# Patient Record
Sex: Female | Born: 1980 | Hispanic: No | Marital: Married | Smoking: Never smoker
Health system: Southern US, Community
[De-identification: ages and names within clinical notes are randomized; demographics above are authoritative.]

## PROBLEM LIST (undated history)

## (undated) DIAGNOSIS — A630 Anogenital (venereal) warts: Secondary | ICD-10-CM

## (undated) DIAGNOSIS — D239 Other benign neoplasm of skin, unspecified: Secondary | ICD-10-CM

## (undated) DIAGNOSIS — IMO0001 Reserved for inherently not codable concepts without codable children: Secondary | ICD-10-CM

## (undated) HISTORY — DX: Other benign neoplasm of skin, unspecified: D23.9

## (undated) HISTORY — DX: Reserved for inherently not codable concepts without codable children: IMO0001

## (undated) HISTORY — DX: Anogenital (venereal) warts: A63.0

## (undated) HISTORY — PX: NO PAST SURGERIES: SHX2092

---

## 2008-10-14 ENCOUNTER — Ambulatory Visit: Payer: Self-pay | Admitting: Gynecology

## 2008-10-15 ENCOUNTER — Ambulatory Visit: Payer: Self-pay | Admitting: Gynecology

## 2008-11-12 ENCOUNTER — Ambulatory Visit: Payer: Self-pay | Admitting: Gynecology

## 2009-07-13 ENCOUNTER — Encounter: Admission: RE | Admit: 2009-07-13 | Discharge: 2009-07-13 | Payer: Self-pay | Admitting: Specialist

## 2009-07-20 ENCOUNTER — Other Ambulatory Visit: Admission: RE | Admit: 2009-07-20 | Discharge: 2009-07-20 | Payer: Self-pay | Admitting: Gynecology

## 2009-07-20 ENCOUNTER — Ambulatory Visit: Payer: Self-pay | Admitting: Gynecology

## 2009-07-20 LAB — CONVERTED CEMR LAB: Pap Smear: NORMAL

## 2010-02-01 ENCOUNTER — Ambulatory Visit: Payer: Self-pay | Admitting: Gynecology

## 2010-04-04 ENCOUNTER — Ambulatory Visit: Payer: Self-pay | Admitting: Internal Medicine

## 2010-04-04 DIAGNOSIS — E049 Nontoxic goiter, unspecified: Secondary | ICD-10-CM | POA: Insufficient documentation

## 2010-04-04 DIAGNOSIS — D239 Other benign neoplasm of skin, unspecified: Secondary | ICD-10-CM

## 2010-04-04 HISTORY — DX: Other benign neoplasm of skin, unspecified: D23.9

## 2010-04-06 ENCOUNTER — Telehealth: Payer: Self-pay | Admitting: Internal Medicine

## 2010-04-06 LAB — CONVERTED CEMR LAB
ALT: 25 units/L (ref 0–35)
Basophils Absolute: 0 10*3/uL (ref 0.0–0.1)
Calcium: 9.3 mg/dL (ref 8.4–10.5)
Chloride: 103 meq/L (ref 96–112)
Cholesterol: 162 mg/dL (ref 0–200)
Creatinine, Ser: 0.7 mg/dL (ref 0.4–1.2)
Eosinophils Absolute: 0.2 10*3/uL (ref 0.0–0.7)
Eosinophils Relative: 3.9 % (ref 0.0–5.0)
GFR calc non Af Amer: 98.59 mL/min (ref >60)
Glucose, Bld: 77 mg/dL (ref 70–99)
Lymphocytes Relative: 40.3 % (ref 12.0–46.0)
Lymphs Abs: 2 10*3/uL (ref 0.7–4.0)
MCHC: 34.3 g/dL (ref 30.0–36.0)
Monocytes Relative: 4.2 % (ref 3.0–12.0)
Neutro Abs: 2.6 10*3/uL (ref 1.4–7.7)
Neutrophils Relative %: 51.3 % (ref 43.0–77.0)
RBC: 4.21 M/uL (ref 3.87–5.11)
WBC: 5 10*3/uL (ref 4.5–10.5)

## 2010-04-19 ENCOUNTER — Ambulatory Visit: Payer: Self-pay | Admitting: Internal Medicine

## 2010-04-19 LAB — CONVERTED CEMR LAB: Rapid Strep: NEGATIVE

## 2010-06-21 NOTE — Assessment & Plan Note (Signed)
Summary: NEW TO EST,WANTS CPX,BCBS INS/RH......   Vital Signs:  Patient profile:   30 year old female Height:      62 inches Weight:      146.50 pounds BMI:     26.89 Pulse rate:   82 / minute Pulse rhythm:   regular BP sitting:   122 / 80  (left arm) Cuff size:   regular  Vitals Entered By: Army Fossa CMA (April 04, 2010 2:59 PM) CC: CPX- not fasting  Comments c/o pain in her breast when on period.     History of Present Illness: CPX    Preventive Screening-Counseling & Management  Alcohol-Tobacco     Smoking Status: never  Caffeine-Diet-Exercise     Does Patient Exercise: yes      Drug Use:  no.    Past History:  Family History: Last updated: 04/04/2010  Breast cancer -- denies  cervical cancer-- GM  colon ca--no MI-no DM--no HTN--no  Social History: Last updated: 04/04/2010 married G4, 3 children, lost one  baby original from [place]  Never Smoked Alcohol use-no Drug use-no works at a [employer] Regular exercise- rarely   Risk Factors: Exercise: yes (04/04/2010)  Risk Factors: Smoking Status: never (04/04/2010)  Past Medical History: birth control-- husband vasectomy  thyromegaly, s/p 2 u/s per patient , last 2011?--- pt  told u/s was ok   Past Surgical History: no surgeries   Family History:  Breast cancer -- denies  cervical cancer-- GM  colon ca--no MI-no DM--no HTN--no  Social History: married G4, 3 children, lost one  baby original from [place]  Never Smoked Alcohol use-no Drug use-no works at a [employer] Regular exercise- rarely  Smoking Status:  never Drug Use:  no Does Patient Exercise:  yes  Review of Systems General:  Denies fever and weight loss. CV:  Denies chest pain or discomfort and swelling of feet. Resp:  Denies cough and shortness of breath. GI:  Denies bloody stools, nausea, and vomiting. GU:  period regular mostly, last 6 days she has a gynecologist, complaining of occasional  tenderness and the external aspect of the right breast. Self breast exam  done regularly, occasionally feels a "knot" under the nipple on the  **R**. MS:  occasionally has low back pain . Psych:  quite emotional, her elderly mother is moving to a ALF.  Physical Exam  General:  alert and well-developed.  slightly  overweight Neck:  symetric, non tender , slightly  enlarged thyroid gland  Breasts:  No mass, nodules, thickening, tenderness, bulging, retraction, inflamation, nipple discharge or skin changes noted.  no axillary lymphadenopathy Lungs:  normal respiratory effort, no intercostal retractions, no accessory muscle use, and normal breath sounds.   Heart:  normal rate, regular rhythm, and no murmur.   Abdomen:  soft, non-tender, no distention, no masses, no guarding, and no rigidity.   Extremities:  no lower extremity edema Neurologic:  alert & oriented X3, strength normal in all extremities, and gait normal.   Skin:  has several moles. There is one particularly at the right side of the neck that seems to slightly hard, bicolor, irregular borders, < 1/2cm . A similar but smaller mole in the left cheek   Impression & Recommendations:  Problem # 1:  ROUTINE GENERAL MEDICAL EXAM@HEALTH  CARE FACL (ICD-V70.0) Td 05-2009 declined flu shot  diet , exercise discussed  labs, no fasting, will check only a total cholesterol recommend to continue doing self breast exam, call if she has  any persistent-hard dominant mass Orders: Venipuncture (62952) TLB-BMP (Basic Metabolic Panel-BMET) (80048-METABOL) TLB-CBC Platelet - w/Differential (85025-CBCD) TLB-Hepatic/Liver Function Pnl (80076-HEPATIC) T-Vitamin D (25-Hydroxy) (84132-44010) TLB-Cholesterol, Total (82465-CHO) Specimen Handling (27253)  Problem # 2:  MOLE (ICD-216.9) refer to derm in reference to a mole in the right side of the neck and left cheek warning  signs of skin cancer discussed  Orders: Dermatology Referral  (Derma)  Problem # 3:  GOITER (ICD-240.9) reports a history of thyromegaly, status post 2 ultrasounds and blood tests. Told they were  ok   Patient Instructions: 1)  Please schedule a follow-up appointment in 1 year.    Orders Added: 1)  Venipuncture [36415] 2)  TLB-BMP (Basic Metabolic Panel-BMET) [80048-METABOL] 3)  TLB-CBC Platelet - w/Differential [85025-CBCD] 4)  TLB-Hepatic/Liver Function Pnl [80076-HEPATIC] 5)  T-Vitamin D (25-Hydroxy) [66440-34742] 6)  TLB-Cholesterol, Total [82465-CHO] 7)  Specimen Handling [99000] 8)  New Patient 18-39 years [99385] 21)  Dermatology Referral [Derma]   Immunization History:  Tetanus/Td Immunization History:    Tetanus/Td:  historical (05/22/2009)   Immunization History:  Tetanus/Td Immunization History:    Tetanus/Td:  Historical (05/22/2009)    Preventive Care Screening  Pap Smear:    Date:  07/20/2009    Results:  normal-per pt   Last Tetanus Booster:    Date:  05/22/2009    Results:  Historical     Risk Factors:  Tobacco use:  never Drug use:  no Alcohol use:  no Exercise:  yes  PAP Smear History:     Date of Last PAP Smear:  07/20/2009    Results:  normal-per pt

## 2010-06-21 NOTE — Progress Notes (Signed)
Summary: problem with referral  Phone Note Call from Patient Call back at Home Phone [phone]   Caller: co-worker Summary of Call: Female co-worker of patient called to see if he could get info about her referral appointment because the patient did not understand what she needed to do about this referral---do we have an interpreting service that can call her about the appointment on 11/18--does she still have that appointment??    If we can call to get an interpreter for a visit here--can the dermatologist call for an interpreter??  I told co-worker that, due to privacy, I could not release any information to him Initial call taken by: Jerolyn Shin,  April 06, 2010 12:59 PM  Follow-up for Phone Call        I SPOKE W/PATIENT SHE WAS ABLE TO GIVE ME PHONE # OF CO-WORKER of [phone].  HAD TO LEAVE VM FOR HIM TO CALL ME BACK Magdalen Spatz [facility]  April 06, 2010 1:16 PM  PATIENT'S FEMALE CO-WORKER CALLED ME BACK (HE IS HER SUPERVISOR) HE TOOK DOWN ALL APPT DETAILS & WILL ALSO INFORM HER SHE NEEDS SOMEONE WITH HER TO THE APPT THAT CAN SPEAK ENGLISH.  PT IS NOW AWARE.  Follow-up by: Magdalen Spatz [facility],  April 06, 2010 1:39 PM

## 2010-06-21 NOTE — Assessment & Plan Note (Signed)
Summary: sore throat/cbs   Vital Signs:  Patient profile:   30 year old female Weight:      1445.13 pounds Temp:     98.2 degrees F oral Pulse rate:   72 / minute Pulse rhythm:   regular BP sitting:   118 / 76  (left arm) Cuff size:   regular  Vitals Entered By: Army Fossa CMA (April 19, 2010 1:25 PM) CC: Pt here c/o HA, pains in neck, chills and sore thorat.  Comments started last night [employer] w [employer]   History of Present Illness: F  x 2 nights associated to frontal HAs and pain behind the eyes   ROS some ST + cough w/ mild amount of yellow sputum no N-V-D no rash some myalgias w/  fever  Current Medications (verified): 1)  None  Allergies (verified): No Known Drug Allergies  Past History:  Past Medical History: Reviewed history from 04/04/2010 and no changes required. birth control-- husband vasectomy  thyromegaly, s/p 2 u/s per patient , last 2011?--- pt  told u/s was ok   Past Surgical History: Reviewed history from 04/04/2010 and no changes required. no surgeries   Social History: Reviewed history from 04/04/2010 and no changes required. married G4, 3 children, lost one  baby original from [place]  Never Smoked Alcohol use-no Drug use-no works at a [employer] Regular exercise- rarely   Physical Exam  General:  alert and well-developed.  nontoxic  Head:  face symetric, no TTP Ears:  R ear normal and L ear normal.   Nose:  no congested  Mouth:  no red or d/c  Lungs:  normal respiratory effort and no intercostal retractions.  few ronchi, no wheezoing or crakles  Heart:  normal rate, regular rhythm, and no murmur.   Abdomen:  soft and non-tender.     Impression & Recommendations:  Problem # 1:  FEVER (ICD-780.60)  fever for 2 days, she doesn't look toxic. strep test and mono spot negative She does have cough and some rhonchi on exam. bronchitis? I advised the patient to drink plenty of fluids, take Tylenol, start an   antibiotic and call me if she's not better. If that is the case she will need a chest x-ray and further eval. Instructions discussed in Spanish  Orders: Rapid Strep (16109) Monospot (60454)  Complete Medication List: 1)  Zithromax Z-pak 250 Mg Tabs (Azithromycin) .... As directed   Patient Instructions: 1)  tome muchis liquidos y tylenol para la fiebre  2)  mucinex DM 1 pastilla cada 12 horas si esta tosiendo mucho 3)  empieze a Pharmacologist, antibiotico,  x 5 dias 4)  llame o vaya a la emergencia si tiene una fiebre muy alta, se pone peor o no se mejora en unos dias  Prescriptions: ZITHROMAX Z-PAK 250 MG TABS (AZITHROMYCIN) as directed  #1 x 0   Entered and Authorized by:   Nolon Rod. Dyquan Minks MD   Signed by:   Nolon Rod. Nareg Breighner MD on 04/19/2010   Method used:   Print then Give to Patient   RxID:   [id number]    Orders Added: 1)  Rapid Strep [87880] 2)  Est. Patient Level III [30865] 3)  Monospot [78469]    Laboratory Results    Other Tests  Rapid Strep: negative Comments: Army Fossa CMA  April 19, 2010 1:27 PM

## 2010-06-27 ENCOUNTER — Ambulatory Visit (INDEPENDENT_AMBULATORY_CARE_PROVIDER_SITE_OTHER): Payer: BC Managed Care – PPO | Admitting: Gynecology

## 2010-06-27 DIAGNOSIS — N926 Irregular menstruation, unspecified: Secondary | ICD-10-CM

## 2010-06-27 DIAGNOSIS — N898 Other specified noninflammatory disorders of vagina: Secondary | ICD-10-CM

## 2010-06-27 DIAGNOSIS — B373 Candidiasis of vulva and vagina: Secondary | ICD-10-CM

## 2010-06-27 DIAGNOSIS — Z113 Encounter for screening for infections with a predominantly sexual mode of transmission: Secondary | ICD-10-CM

## 2010-07-21 ENCOUNTER — Encounter: Payer: BC Managed Care – PPO | Admitting: Gynecology

## 2010-08-09 ENCOUNTER — Encounter (INDEPENDENT_AMBULATORY_CARE_PROVIDER_SITE_OTHER): Payer: BC Managed Care – PPO | Admitting: Gynecology

## 2010-08-09 ENCOUNTER — Other Ambulatory Visit (HOSPITAL_COMMUNITY)
Admission: RE | Admit: 2010-08-09 | Discharge: 2010-08-09 | Disposition: A | Discharge status: Home / Self Care | Payer: BC Managed Care – PPO | Source: Ambulatory Visit | Attending: Gynecology | Admitting: Gynecology

## 2010-08-09 ENCOUNTER — Other Ambulatory Visit: Payer: Self-pay | Admitting: Gynecology

## 2010-08-09 DIAGNOSIS — N946 Dysmenorrhea, unspecified: Secondary | ICD-10-CM

## 2010-08-09 DIAGNOSIS — R635 Abnormal weight gain: Secondary | ICD-10-CM

## 2010-08-09 DIAGNOSIS — R1011 Right upper quadrant pain: Secondary | ICD-10-CM

## 2010-08-09 DIAGNOSIS — Z01419 Encounter for gynecological examination (general) (routine) without abnormal findings: Secondary | ICD-10-CM

## 2010-08-09 DIAGNOSIS — Z124 Encounter for screening for malignant neoplasm of cervix: Secondary | ICD-10-CM | POA: Insufficient documentation

## 2010-08-10 ENCOUNTER — Other Ambulatory Visit: Payer: Self-pay | Admitting: Gynecology

## 2010-08-22 ENCOUNTER — Other Ambulatory Visit: Payer: Self-pay | Admitting: Gynecology

## 2010-08-22 ENCOUNTER — Ambulatory Visit (HOSPITAL_COMMUNITY)
Admission: RE | Admit: 2010-08-22 | Discharge: 2010-08-22 | Disposition: A | Discharge status: Home / Self Care | Payer: BC Managed Care – PPO | Source: Ambulatory Visit | Attending: Gynecology | Admitting: Gynecology

## 2010-08-22 DIAGNOSIS — R109 Unspecified abdominal pain: Secondary | ICD-10-CM | POA: Insufficient documentation

## 2010-09-29 ENCOUNTER — Ambulatory Visit: Payer: BC Managed Care – PPO | Admitting: Women's Health

## 2010-10-05 ENCOUNTER — Ambulatory Visit (INDEPENDENT_AMBULATORY_CARE_PROVIDER_SITE_OTHER): Payer: BC Managed Care – PPO | Admitting: Gynecology

## 2010-10-05 DIAGNOSIS — N898 Other specified noninflammatory disorders of vagina: Secondary | ICD-10-CM

## 2010-10-05 DIAGNOSIS — Z113 Encounter for screening for infections with a predominantly sexual mode of transmission: Secondary | ICD-10-CM

## 2010-10-05 DIAGNOSIS — B373 Candidiasis of vulva and vagina: Secondary | ICD-10-CM

## 2010-10-05 DIAGNOSIS — N766 Ulceration of vulva: Secondary | ICD-10-CM

## 2011-02-08 ENCOUNTER — Ambulatory Visit (INDEPENDENT_AMBULATORY_CARE_PROVIDER_SITE_OTHER): Payer: BC Managed Care – PPO | Admitting: Internal Medicine

## 2011-02-08 ENCOUNTER — Encounter: Payer: Self-pay | Admitting: Internal Medicine

## 2011-02-08 ENCOUNTER — Encounter: Payer: Self-pay | Admitting: *Deleted

## 2011-02-08 VITALS — BP 100/70 | HR 69 | Temp 97.9°F | Ht 63.0 in | Wt 149.2 lb

## 2011-02-08 DIAGNOSIS — J060 Acute laryngopharyngitis: Secondary | ICD-10-CM

## 2011-02-08 DIAGNOSIS — B9789 Other viral agents as the cause of diseases classified elsewhere: Secondary | ICD-10-CM

## 2011-02-08 DIAGNOSIS — B349 Viral infection, unspecified: Secondary | ICD-10-CM

## 2011-02-08 LAB — POCT RAPID STREP A (OFFICE): Rapid Strep A Screen: NEGATIVE

## 2011-02-08 NOTE — Patient Instructions (Addendum)
Rest, fluids , tylenol For cough, take Mucinex DM twice a day as needed  call if no better in few days Call anytime if the symptoms are severe, you have high fever, a rash

## 2011-02-08 NOTE — Progress Notes (Signed)
  Subjective:    Patient ID: Anita Parker, female    DOB: 1980-12-25, 30 y.o.   MRN: [id number]  HPI Sx started 5 days ago, initially w/ a lot of sinus congestion and nasal d/c. Now sx are felt mostly at the throat (congestion) Also back pain, upper , lower and even the neck Mild cough  Past Medical History  Diagnosis Date  . GOITER 04/04/2010  . MOLE 04/04/2010   No past surgical history on file.  Review of Systems + subjective fever + ST, moderate  No dysuria or hematuria No rash    Objective:   Physical Exam  Constitutional: She is oriented to person, place, and time. She appears well-developed. No distress.       No toxic  HENT:  Head: Normocephalic and atraumatic.  Right Ear: External ear normal.  Left Ear: External ear normal.       Face NTTP, nose congested . Very mild redness at the throat, no d/c   Eyes: Right eye exhibits no discharge. Left eye exhibits no discharge.  Neck: Normal range of motion. Neck supple.  Cardiovascular: Normal rate, regular rhythm and normal heart sounds.   No murmur heard. Pulmonary/Chest: Effort normal and breath sounds normal. No respiratory distress. She has no wheezes. She has no rales.  Musculoskeletal: She exhibits no edema.  Neurological: She is alert and oriented to person, place, and time.  Skin: She is not diaphoretic.  Psychiatric: She has a normal mood and affect. Her behavior is normal. Thought content normal.          Assessment & Plan:  Viral Syndrome Suspect viral syndrome v. Strep Non toxic apperaring, no rash Strep A: neg Plan -- see instructions, discussed in spanish Work excuse yesterdat-today and tomorrow

## 2011-04-17 ENCOUNTER — Encounter: Payer: Self-pay | Admitting: Internal Medicine

## 2011-04-17 ENCOUNTER — Ambulatory Visit (INDEPENDENT_AMBULATORY_CARE_PROVIDER_SITE_OTHER): Payer: BC Managed Care – PPO | Admitting: Internal Medicine

## 2011-04-17 ENCOUNTER — Other Ambulatory Visit: Payer: Self-pay | Admitting: Internal Medicine

## 2011-04-17 VITALS — BP 106/60 | HR 75 | Temp 98.3°F | Wt 149.0 lb

## 2011-04-17 DIAGNOSIS — R109 Unspecified abdominal pain: Secondary | ICD-10-CM

## 2011-04-17 LAB — POCT URINALYSIS DIPSTICK
Nitrite, UA: NEGATIVE
Protein, UA: NEGATIVE
pH, UA: 7

## 2011-04-17 MED ORDER — CYCLOBENZAPRINE HCL 10 MG PO TABS: 10.0000 mg | ORAL_TABLET | Freq: Two times a day (BID) | ORAL | Status: DC | PRN

## 2011-04-17 NOTE — Progress Notes (Signed)
  Subjective:    Patient ID: Anita Parker, female    DOB: June 01, 1980, 30 y.o.   MRN: [id number]  HPI One month history of right flank pain, it is worse the day she works and when she moves her torso. Also increase by staying in bed  Pain does not change with po intake or with her periods. No radiation.   Past Medical History  Diagnosis Date  . GOITER 04/04/2010  . MOLE 04/04/2010  . Birth control     husband's vasectomy    Review of Systems No fever or chills No nausea, vomiting, diarrhea or blood in the stools. No dysuria or gross hematuria Periods are irregular.     Objective:   Physical Exam  Constitutional: She appears well-developed and well-nourished. No distress.  Abdominal: Soft. Bowel sounds are normal.         Abdomen is nondistended, soft, tender at both sides of the ductus, not a new finding per patient "always tender when I go to the doctor"  Musculoskeletal: She exhibits no edema.       No antalgic posture, not tender to palpation in the back.  Skin: Skin is warm and dry. She is not diaphoretic.          Assessment & Plan:

## 2011-04-17 NOTE — Patient Instructions (Signed)
Warm compress Tylenol 500 mg 2 tabs 4 times a day or ibuprofen 200 mg 3 tabs 3 times a day as needed for pain Flexeril twice a day , watch for drowsiness Call if no better in 2 weeks

## 2011-04-17 NOTE — Assessment & Plan Note (Addendum)
One-month history of right-sided flank pain,by the patient description it sounds musculoskeletal. Chart reviewed, had a normal abdominal and pelvic ultrasound 08-2010. Udip-- + blood , UCX pending   Plan: Treat as a muscle skeletal pain, instructions discussed in spanish abx if UCX + Consider redo a u/s or a CT (r/o stones although sx are not c/w urolithiasis)

## 2011-04-19 LAB — URINE CULTURE: Colony Count: NO GROWTH

## 2011-08-14 ENCOUNTER — Encounter: Payer: Self-pay | Admitting: Gynecology

## 2011-08-14 ENCOUNTER — Ambulatory Visit (INDEPENDENT_AMBULATORY_CARE_PROVIDER_SITE_OTHER): Payer: BC Managed Care – PPO | Admitting: Gynecology

## 2011-08-14 VITALS — BP 118/70 | Ht 62.0 in | Wt 151.0 lb

## 2011-08-14 DIAGNOSIS — N63 Unspecified lump in unspecified breast: Secondary | ICD-10-CM

## 2011-08-14 DIAGNOSIS — N6310 Unspecified lump in the right breast, unspecified quadrant: Secondary | ICD-10-CM

## 2011-08-14 DIAGNOSIS — R635 Abnormal weight gain: Secondary | ICD-10-CM

## 2011-08-14 DIAGNOSIS — Z01419 Encounter for gynecological examination (general) (routine) without abnormal findings: Secondary | ICD-10-CM

## 2011-08-14 LAB — CBC WITH DIFFERENTIAL/PLATELET
Basophils Absolute: 0 10*3/uL (ref 0.0–0.1)
Basophils Relative: 0 % (ref 0–1)
MCHC: 32.7 g/dL (ref 30.0–36.0)
Monocytes Absolute: 0.3 10*3/uL (ref 0.1–1.0)
Neutro Abs: 2.6 10*3/uL (ref 1.7–7.7)
Neutrophils Relative %: 50 % (ref 43–77)
RBC: 4.52 MIL/uL (ref 3.87–5.11)
RDW: 12.7 % (ref 11.5–15.5)

## 2011-08-14 LAB — LIPID PANEL: Triglycerides: 41 mg/dL (ref <150)

## 2011-08-14 LAB — GLUCOSE, RANDOM: Glucose, Bld: 82 mg/dL (ref 70–99)

## 2011-08-14 NOTE — Progress Notes (Signed)
Anita Parker 09-28-1980 [id number]   History:    31 y.o.  for annual exam will for 3 Ab1 with complaint of weight gain in tenderness and nodularity on her right breast before her cycles. Patient's husband has had a vasectomy. Her cycles otherwise regular now. She does her monthly self breast examinations.  Past medical history,surgical history, family history and social history were all reviewed and documented in the EPIC chart.  Gynecologic History Patient's last menstrual period was 07/21/2011. Contraception: vasectomy Last Pap: 2012. Results were: normal Last mammogram: No prior study. Results were: No prior study  Obstetric History OB History    Grav Para Term Preterm Abortions TAB SAB Ect Mult Living   4 3 3  1  1   3      # Outc Date GA Lbr Len/2nd Wgt Sex Del Anes PTL Lv   1 TRM     M SVD  No Yes   2 TRM     F SVD  No Yes   3 TRM     M SVD  No Yes   4 SAB                ROS:  Was performed and pertinent positives and negatives are included in the history.  Exam: chaperone present  BP 118/70  Ht 5\' 2"  (1.575 m)  Wt 151 lb (68.493 kg)  BMI 27.62 kg/m2  LMP 07/21/2011  Body mass index is 27.62 kg/(m^2).  General appearance : Well developed well nourished female. No acute distress HEENT: Neck supple, trachea midline, no carotid bruits, no thyroidmegaly Lungs: Clear to auscultation, no rhonchi or wheezes, or rib retractions  Heart: Regular rate and rhythm, no murmurs or gallops Breast:Examined in sitting and supine position were symmetrical in appearance, no palpable masses or tenderness on left breast, no skin retraction, no nipple inversion, no nipple discharge, no skin discoloration, no axillary or supraclavicular lymphadenopathy. On the right breast at approximately the 10:00 position periareolar region there was a slight ridge and tender area.  Abdomen: no palpable masses or tenderness, no rebound or guarding Extremities: no edema or skin discoloration or  tenderness  Pelvic:  Bartholin, Urethra, Skene Glands: Within normal limits             Vagina: No gross lesions or discharge  Cervix: No gross lesions or discharge  Uterus  anteverted, normal size, shape and consistency, non-tender and mobile  Adnexa  Without masses or tenderness  Anus and perineum  normal   Rectovaginal  normal sphincter tone without palpated masses or tenderness             Hemoccult not done     Assessment/Plan:  31 y.o. female for annual exam who is in the process of starting her menstrual cycle. We'll have her return back to the office 7-10 days after the start of her next cycle to reexamine her right breast. The tender right area at the 10:00 position there is a region of slight ridge effect was noted. If it still present after reexamining her breast we'll order a diagnostic mammogram and possible ultrasound. Because of her weight gain we'll be checking her TSH along with a fasting blood sugar will also check her lipid profile CBC and urinalysis. We discussed a new screening guidelines for Pap smear so Pap smear was done today. All the above was discussed in Spanish and we'll follow accordingly.    Ok Edwards MD, 11:40 AM 08/14/2011

## 2011-08-14 NOTE — Patient Instructions (Signed)
Recuerdate que la tengo que examinar (los senos) 10 dias despues del comienzo delproximo periodo.

## 2011-08-15 LAB — URINALYSIS W MICROSCOPIC + REFLEX CULTURE
Bacteria, UA: NONE SEEN
Bilirubin Urine: NEGATIVE
Casts: NONE SEEN
Crystals: NONE SEEN
Glucose, UA: NEGATIVE mg/dL
Nitrite: NEGATIVE
Specific Gravity, Urine: 1.011 (ref 1.005–1.030)
pH: 5.5 (ref 5.0–8.0)

## 2011-08-16 LAB — URINE CULTURE: Organism ID, Bacteria: NO GROWTH

## 2011-08-28 ENCOUNTER — Ambulatory Visit (INDEPENDENT_AMBULATORY_CARE_PROVIDER_SITE_OTHER): Payer: BC Managed Care – PPO | Admitting: Gynecology

## 2011-08-28 ENCOUNTER — Encounter: Payer: Self-pay | Admitting: Gynecology

## 2011-08-28 VITALS — BP 110/70

## 2011-08-28 DIAGNOSIS — N6310 Unspecified lump in the right breast, unspecified quadrant: Secondary | ICD-10-CM | POA: Insufficient documentation

## 2011-08-28 DIAGNOSIS — N63 Unspecified lump in unspecified breast: Secondary | ICD-10-CM

## 2011-08-28 NOTE — Progress Notes (Signed)
Patient is a 30 year old who was seen in the office less than 2 weeks ago as part of her annual exam with incidental finding of a right breast mass was noted. She was in the process of starting her menstrual cycle was asked to return for re\re examination of her breast. Patient's husband has had a vasectomy patient taken no hormonal medications and has no family history of breast cancer.  Breast exam:  Physical Exam  Pulmonary/Chest:     both breasts were examined sitting supine position and were symmetrical in appearance with no skin discoloration no nipple inversion no skin retraction no supraclavicular axillary lymphadenopathy. No masses palpated in the left breast. Right breast a 1-1/2 cm nodule at the 10:00 position periareolar region was noted. It was nontender.  Assessment/plan: Right breast nodule. Patient will be sent for a diagnostic mammogram and possible ultrasound of the right breast. Will await the results and manage accordingly. Instructions were provided to the patient Spanish.

## 2011-08-28 NOTE — Patient Instructions (Signed)
Te vamos a llamar esta semana con la decha y la hora para la mammografia.

## 2011-08-29 ENCOUNTER — Telehealth: Payer: Self-pay | Admitting: *Deleted

## 2011-08-29 ENCOUNTER — Other Ambulatory Visit: Payer: Self-pay | Admitting: *Deleted

## 2011-08-29 DIAGNOSIS — N63 Unspecified lump in unspecified breast: Secondary | ICD-10-CM

## 2011-08-29 NOTE — Telephone Encounter (Signed)
Patient informed by [facility] CMA that [facility] for 09[facility]04[facility]11 @ 1pm.

## 2011-08-29 NOTE — Telephone Encounter (Signed)
Message copied by Valeda Malm L on Tue Aug 29, 2011  4:16 PM ------      Message from: Ok Edwards      Created: Mon Aug 28, 2011  9:03 AM       Anita Parker, this patient will need a diagnostic mammogram and possible ultrasound of the right breast:  Right breast a 1-1/2 cm nodule at the 10:00 position periareolar region . Mondays or Tuesdays are better for her. Thank you

## 2011-09-04 ENCOUNTER — Ambulatory Visit
Admission: RE | Admit: 2011-09-04 | Discharge: 2011-09-04 | Disposition: A | Discharge status: Home / Self Care | Payer: BC Managed Care – PPO | Source: Ambulatory Visit | Attending: Gynecology | Admitting: Gynecology

## 2011-09-04 DIAGNOSIS — N63 Unspecified lump in unspecified breast: Secondary | ICD-10-CM

## 2011-10-11 ENCOUNTER — Emergency Department (HOSPITAL_COMMUNITY)
Admission: EM | Admit: 2011-10-11 | Discharge: 2011-10-11 | Disposition: A | Discharge status: Home / Self Care | Payer: BC Managed Care – PPO | Attending: Emergency Medicine | Admitting: Emergency Medicine

## 2011-10-11 ENCOUNTER — Emergency Department (HOSPITAL_COMMUNITY): Payer: BC Managed Care – PPO

## 2011-10-11 ENCOUNTER — Encounter (HOSPITAL_COMMUNITY): Payer: Self-pay | Admitting: *Deleted

## 2011-10-11 DIAGNOSIS — M545 Low back pain, unspecified: Secondary | ICD-10-CM | POA: Insufficient documentation

## 2011-10-11 DIAGNOSIS — R109 Unspecified abdominal pain: Secondary | ICD-10-CM | POA: Insufficient documentation

## 2011-10-11 LAB — URINALYSIS, ROUTINE W REFLEX MICROSCOPIC
Bilirubin Urine: NEGATIVE
Glucose, UA: NEGATIVE mg/dL
Hgb urine dipstick: NEGATIVE
Ketones, ur: NEGATIVE mg/dL
Nitrite: NEGATIVE
Protein, ur: NEGATIVE mg/dL
Specific Gravity, Urine: 1.003 — ABNORMAL LOW (ref 1.005–1.030)
Urobilinogen, UA: 0.2 mg/dL (ref 0.0–1.0)
pH: 5.5 (ref 5.0–8.0)

## 2011-10-11 LAB — URINE MICROSCOPIC-ADD ON

## 2011-10-11 LAB — PREGNANCY, URINE: Preg Test, Ur: NEGATIVE

## 2011-10-11 MED ORDER — PREDNISONE 20 MG PO TABS: 40.0000 mg | ORAL_TABLET | Freq: Every day | ORAL | Status: AC

## 2011-10-11 MED ORDER — IBUPROFEN 200 MG PO TABS: 600.0000 mg | ORAL_TABLET | Freq: Once | ORAL | Status: DC

## 2011-10-11 MED ORDER — PANTOPRAZOLE SODIUM 40 MG PO TBEC: 40.0000 mg | DELAYED_RELEASE_TABLET | Freq: Every day | ORAL | Status: DC

## 2011-10-11 NOTE — ED Notes (Signed)
Per interpretor phone,  Patient is having ongoing abd pain.  she has seen her md x 2.  She is being treated for uti.  Patient complains of ongoing right side pain.  Patient states she has no pain when she voids.

## 2011-10-11 NOTE — Discharge Instructions (Signed)
Dolor abdominal, versin ampliada (Abdominal Pain, Nonspecific) El anlisis podra no mostrar la razn exacta por la que tiene dolor abdominal. Debido a que hay muchas causas distintas de dolor abdominal, se podr necesitar otro control y ms anlisis. Es muy importante el seguimiento para observar los sntomas duraderos (persistentes) o los que empeoran. Una causa posible de dolor abdominal en cualquier persona que an tiene su apndice es la apendicitis aguda. La apendicitis es a menudo difcil de diagnosticar. Los anlisis de sangre, orina, ultrasonido y tomografa computada no pueden descartar por completo la apendicitis u otra causas de dolor abdominal. A veces, slo los cambios que se producen a travs del tiempo permitirn determinar si el dolor abdominal se debe al apendicitis o a otras causas. Otros problemas potenciales que pueden requerir ciruga tambin pueden tomar algn tiempo hasta ser evidentes. Debido a esto, es importante seguir todas las instrucciones de ms abajo. INSTRUCCIONES PARA EL CUIDADO DOMICILIARIO  Descanse todo lo que pueda.   No ingiera alimentos slidos hasta que el dolor desaparezca.   Cuando un adulto o un nio siente dolor: Puede beneficiarlo una dieta basada en agua, t liviano descafeinado, caldo o consom, gelatina, solucin de rehidratacin oral, helados de agua o trocitos de hielo.   Cuando el adulto o el nio no sienten ms dolor: Consuma una dieta liviana (tostadas secas, crackers, jugo de manzana o arroz blanco). Incorpore ms alimentos lentamente, siempre que esto no le cause ningn trastorno. No consuma productos lcteos (incluyendo queso y huevos) ni ingiera alimentos condimentados, grasos, fritos o con gran cantidad de fibra.   No consuma alcohol, cafena ni cigarrillos.   Tome sus medicamentos regularmente, excepto que el profesional le indique lo contrario.   Utilice los medicamentos de venta libre o de prescripcin para el dolor, el malestar o la  fiebre, segn se lo indique el profesional que lo asiste.   Utilice los medicamentos de venta libre o de prescripcin para el dolor, el malestar o la fiebre, segn se lo indique el profesional que lo asiste. No administre aspirina a los nios.  Si el mdico le ha dado fecha para una visita de control, es importante que concurra. No cumplir con este control puede dar como resultado que el dao, el dolor o la discapacidad sean permanentes (crnicos). Si tiene problemas para asistir al control, deber comunicarlo en este establecimiento para recibir asesoramiento.  SOLICITE ATENCIN MDICA DE INMEDIATO SI:  Usted o su nio han sufrido dolor por ms de 24 horas.   El dolor empeora, cambia de lugar o se siente diferente.   Usted o su nio tienen una temperatura oral de ms de 102 F (38.9 C) y no puede ser controlada con medicamentos.   Su beb tiene ms de 3 meses y su temperatura rectal es de 102 F (38.9 C) o ms.   Su beb tiene 3 meses o menos y su temperatura rectal es de 100.4 F (38 C) o ms.   Usted o su hijo tienen escalofros.   Continan con vmitos y no pueden retener lquidos.   Observa sangre en el vmito o en la materia fecal.   Las heces son oscuras o negras.   Los movimientos intestinales son frecuentes.   Los movimientos intestinales se detienen (hay una obstruccin) o no pueden eliminarse los gases.   Siente dolor al orinar o lo hace con frecuencia u observa sangre en la orina.   La piel y la zona blanca de los ojos cambian de color y se tornan amarillos.     Observa que el estmago se hincha o est ms grande.   Sienten mareos o desmayos.   Sienten dolor en el pecho o la espalda.  EST SEGURO QUE:   Comprende las instrucciones para el alta mdica.   Controlar su enfermedad.   Solicitar atencin mdica de inmediato segn las indicaciones.  Document Released: 08/15/2007 Document Revised: 04/27/2011 [employer] Patient Information 2012 [employer]. 

## 2011-10-11 NOTE — ED Provider Notes (Signed)
History    This chart was scribed for Anita Parker. Anita Lamas, MD, MD by Anita Parker. The patient was seen in room STRE3 and the patient's care was started at 1:42PM.   CSN: [id number]  Arrival date & time 10/11/11  1313   First MD Initiated Contact with Patient 10/11/11 1327      Chief Complaint  Patient presents with  . Abdominal Pain    (Consider location/radiation/quality/duration/timing/severity/associated sxs/prior treatment) Patient is a 31 y.o. female presenting with abdominal pain. The history is provided by the patient.  Abdominal Pain The primary symptoms of the illness include abdominal pain. The primary symptoms of the illness do not include fever, shortness of breath, nausea, vomiting, diarrhea or dysuria.  Additional symptoms associated with the illness include back pain. Symptoms associated with the illness do not include chills.   Anita Parker is a 31 y.o. female who presents to the Emergency Department complaining of moderate waxing and waning right flank pain onset 1 month ago. She reports that she has seem PCP 2x. PCP prescribed abx for UTI without relief. Denies surgery and vomiting. There is radiation to lower back. Denies dysuria, nausea, and vomiting.   Past Medical History  Diagnosis Date  . MOLE 04/04/2010  . Birth control     husband's vasectomy    Past Surgical History  Procedure Date  . No past surgeries     Family History  Problem Relation Age of Onset  . Cancer Maternal Grandmother     UTERINE    History  Substance Use Topics  . Smoking status: Never Smoker   . Smokeless tobacco: Never Used  . Alcohol Use: No    OB History    Grav Para Term Preterm Abortions TAB SAB Ect Mult Living   4 3 3  1  1   3       Review of Systems  Constitutional: Negative for fever and chills.  Respiratory: Negative for cough and shortness of breath.   Gastrointestinal: Positive for abdominal pain. Negative for nausea, vomiting and diarrhea.  Genitourinary:  Positive for flank pain. Negative for dysuria.  Musculoskeletal: Positive for back pain.  Skin: Negative for rash.    Allergies  Review of patient's allergies indicates no known allergies.  Home Medications   Current Outpatient Rx  Name Route Sig Dispense Refill  . DICLOFENAC SODIUM 75 MG PO TBEC Oral Take 75 mg by mouth 2 (two) times daily.    . INDOMETHACIN 25 MG PO CAPS Oral Take 25 mg by mouth 3 (three) times daily with meals.    Marland Kitchen NITROFURANTOIN MACROCRYSTAL 100 MG PO CAPS Oral Take 100 mg by mouth 3 (three) times daily.    Marland Kitchen PRESCRIPTION MEDICATION Intramuscular Inject 1 vial into the muscle. Unknown injection for inflammation Patient says she got 4 injections on 5/7 and 1 injection on 5/20      BP 123/72  Pulse 74  Temp(Src) 98.3 F (36.8 C) (Oral)  Resp 16  Ht 5\' 4"  (1.626 m)  Wt 149 lb (67.586 kg)  BMI 25.58 kg/m2  SpO2 100%  Physical Exam  Nursing note and vitals reviewed. Constitutional: She is oriented to person, place, and time. She appears well-developed and well-nourished. No distress.  HENT:  Head: Normocephalic and atraumatic.  Eyes: Conjunctivae are normal. Pupils are equal, round, and reactive to light.  Neck: Normal range of motion. Neck supple.  Pulmonary/Chest: Effort normal. No respiratory distress.  Abdominal: Soft. Normal appearance and bowel sounds are normal. There is no  hepatosplenomegaly. There is Tenderness: right flank.. There is no rebound, no guarding and no CVA tenderness. No hernia.         No deformity    Neurological: She is alert and oriented to person, place, and time.  Skin: Skin is warm and dry. No rash noted. She is not diaphoretic.  Psychiatric: She has a normal mood and affect. Her behavior is normal.    ED Course  Procedures (including critical care time) DIAGNOSTIC STUDIES: Oxygen Saturation is 100% on room air, normal by my interpretation.    COORDINATION OF CARE: 1:45PM EDP discusses pt ED treatment with pt     Labs Reviewed  URINALYSIS, ROUTINE W REFLEX MICROSCOPIC - Abnormal; Notable for the following:    APPearance HAZY (*)    Specific Gravity, Urine 1.003 (*)    Leukocytes, UA TRACE (*)    All other components within normal limits  URINE MICROSCOPIC-ADD ON - Abnormal; Notable for the following:    Squamous Epithelial / LPF FEW (*)    All other components within normal limits  PREGNANCY, URINE   US Abdomen Complete  10/11/2011  *RADIOLOGY REPORT*  Clinical Data: No abdominal pain  ABDOMEN ULTRASOUND  Technique:  Complete abdominal ultrasound examination was performed including evaluation of the liver, gallbladder, bile ducts, pancreas, kidneys, spleen, IVC, and abdominal aorta.  Comparison: 08/22/2010  Findings:  Gallbladder:  There is no evidence for gallstones.  No gallbladder wall thickening or pericholecystic fluid.  The sonographer reports no sonographic Murphy's sign.  Common Bile Duct:  Nondilated at 2 mm diameter.  Liver:  Mild coarsening of the echotexture raises the question of fatty infiltration.  No focal parenchymal abnormality.  No biliary dilation.  IVC:  Normal.  Pancreas:  Normal.  Spleen:  Normal.  Right kidney:  10.7 cm in long axis.  Normal.  Left kidney:  10.9 cm in long axis.  Normal.  Abdominal Aorta:  No aneurysm.  IMPRESSION: No acute findings.  Original Report Authenticated By: ERIC A. MANSELL, M.D.   I reviewed the above U/S and reviewed results per radiologist  1. Abdominal pain       MDM  I personally performed the services described in this documentation, which was scribed in my presence. The recorded information has been reviewed and considered.    Pt with benign exam, no rash, no fever, intermitent pain to right low back and right mid side at flank area, but not CVA, not McBurneys, not in RUQ, Neg Murphy's sign.  Will get U/S to assess kidneys, check UA.  Pt is on oral abx already by PCP    Anita Parker. Crewe Heathman, MD 10/11/11 0981

## 2011-10-17 ENCOUNTER — Encounter: Payer: Self-pay | Admitting: *Deleted

## 2011-10-17 ENCOUNTER — Ambulatory Visit (INDEPENDENT_AMBULATORY_CARE_PROVIDER_SITE_OTHER): Payer: BC Managed Care – PPO | Admitting: Internal Medicine

## 2011-10-17 ENCOUNTER — Encounter: Payer: Self-pay | Admitting: Internal Medicine

## 2011-10-17 VITALS — BP 112/72 | HR 67 | Temp 97.9°F | Wt 151.0 lb

## 2011-10-17 DIAGNOSIS — R109 Unspecified abdominal pain: Secondary | ICD-10-CM

## 2011-10-17 MED ORDER — CYCLOBENZAPRINE HCL 10 MG PO TABS: 10.0000 mg | ORAL_TABLET | Freq: Every evening | ORAL | Status: AC | PRN

## 2011-10-17 NOTE — Patient Instructions (Addendum)
Release of information, Dr. Quintella Reichert, records from the last month  -------------------  compresas calientes en la espalda 3 veces al dia motrin 200 mg 2 rabletas 3 veces al dia con los alimentos, deje de tomar motrin si le molesta el estomago Flexeril 10 mg una tableta cada noche (relajante muscular)

## 2011-10-17 NOTE — Assessment & Plan Note (Addendum)
Here with ongoing flank pain, now also w/ mid back pain with radiation to the right buttock. Recent ultrasound and  UA negative done at the ER (-) although she was already taken antibiotics prescribed by Dr. Quintella Reichert. Description of the pain is now c/w a muscle skeletal pain and ?radiculopathy. Plan: Get records from Dr. Quintella Reichert Refer to ortho start treatment for a muscle skeletal pain---> Warm compress, Motrin as needed, Flexeril last night

## 2011-10-17 NOTE — Progress Notes (Signed)
  Subjective:    Patient ID: Anita Parker, female    DOB: 1981-05-11, 31 y.o.   MRN: [id number]  HPI Acute visit, walk in. Patient continue with right flank pain since 03-2011 when I saw her. She reports that to around 10/09/2011 went to see Dr. Quintella Reichert, a urinalysis was done , got a IM shot and a po abx. She eventually went to the ER 10/11/2011 with the same symptoms, chart is reviewed--->  a ultrasound was negative, a urinalysis was negative. Was prescribed Prilosec and prednisone She is here for followup, she is feeling about the same. At this point, compared to 03-2011 the pain has expanded from the  flank to the mid back and  some radiation to the right buttock. 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 The pain also is worse when she bends her torso.  Past Medical History  Diagnosis Date  . MOLE 04/04/2010  . Birth control     husband's vasectomy     Review of Systems Denies abdominal pain per say, fever chills. No dysuria or gross hematuria. No nausea or vomiting.     Objective:   Physical Exam  General -- alert, well-developed. No apparent distress.   Abdomen--soft, non-tender, no distention, no masses, no HSM, no guarding, and no rigidity.   Extremities-- no pretibial edema bilaterally  Back-- slightly tender at the lower back Neurologic-- alert & oriented X3 , DTRs and strength normal in all extremities. Straight leg test negative Psych-- Cognition and judgment appear intact. Alert and cooperative with normal attention span and concentration.  not anxious appearing and not depressed appearing.      Assessment & Plan:

## 2012-01-15 ENCOUNTER — Ambulatory Visit (INDEPENDENT_AMBULATORY_CARE_PROVIDER_SITE_OTHER): Payer: BC Managed Care – PPO | Admitting: Gynecology

## 2012-01-15 ENCOUNTER — Encounter: Payer: Self-pay | Admitting: Gynecology

## 2012-01-15 VITALS — BP 116/70

## 2012-01-15 DIAGNOSIS — N9089 Other specified noninflammatory disorders of vulva and perineum: Secondary | ICD-10-CM | POA: Insufficient documentation

## 2012-01-15 MED ORDER — OXYCODONE-ACETAMINOPHEN 5-500 MG PO CAPS: 1.0000 | ORAL_CAPSULE | ORAL | Status: AC | PRN

## 2012-01-15 NOTE — Progress Notes (Addendum)
Patient's a 31 year old presented to the office today complaining of 2 lesions that she noted in the lower aspect of her vaginal area. She did state that her partner had noted some growths on his penis as well. Patient's husband has had a vasectomy and her menstrual cycles are otherwise regular.  Exam: The area of the fourchette there were 2 verrucus-like growths on each side of the introitus. The rest of the external genitalia vagina and cervix and fornix with no lesion seen.  The patient was counseled for excision of these 2 lesions and to submit specimen for pathological evaluation. The area was cleansed with Betadine solution. 1% lidocaine was infiltrated on the base. Both lesions were excised and labeled respectively and submitted for pathological evaluation. Silver nitrate was applied to the base for hemostasis. She'll return back to the office in 2 weeks for followup. All the above instructions were discussed in Spanish and we'll follow accordingly.

## 2012-01-15 NOTE — Patient Instructions (Addendum)
Verrugas genitales (Genital Warts)  Las verrugas genitales son una enfermedad de transmisin sexual. Pueden aparecer como pequeos bultos en los tejidos de la zona genital.  CAUSAS Las verrugas genitales ests ocasionadas por un virus denominado VPH (virus del papiloma humano) Es la ms frecuente entre las enfermedades de transmisin sexual e infeccin de los rganos sexuales. Esta infeccin se disemina por mantener relaciones sexuales sin proteccin con una persona infectada. Puede transmitirse practicando sexo vaginal, anal u oral. Muchas personas no saben que estn infectadas. Pueden padecer la infeccin durante aos, y manifestarse pocos o ningn sntoma (problema) Tambin pueden transmitir la infeccin a sus parejas sin saberlo. SNTOMAS  Picazn e irritacin de la zona genital.   Verrugas que sangran.   Relaciones sexuales dolorosas.  DIAGNSTICO Las verrugas se reconocen a simple vista en la vagina, en la vulva, el perineo, el ano y el recto. Algunas pruebas pueden diagnosticar las verrugas genitales:  Papanicolau.   Examen de una muestra de tejido biopsia.   Colposcopa Se trata de un instrumento que aumenta el tamao de lo que se observa al examinar la vagina y el cuello uterino. Al aplicar ciertas soluciones, las clulas de VPH cambian de color.  TRATAMIENTO Las verrugas pueden eliminarse del siguiente modo:   Aplicando sustancias qumicas como cantaridina o podofilina.   Nitrgeno lquido congelante (crioterapia).   Inmunoterapia con inyecciones de cndida o tricofiton.   Tratamiento con lser.   Quemarlas a travs de una sonda con electricidad (electrocauterizacin).   Inyecciones de interfern.   Ciruga.  PREVENCIN La vacuna contra el VPH puede ayudar a prevenir la infeccin por VPH que causa verrugas genitales y cncer del cuello uterino. Se recomienda administrar la vacuna a personas de entre 9 y 26 aos de edad. La vacuna puede no funcionar correctamente o no  hacer efecto en absoluto si usted ya tiene VPH. Estas vacunas no deben administrarse a mujeres embarazadas. INSTRUCCIONES PARA EL CUIDADO DOMICILIARIO  Es importante seguir las indicaciones del profesional que la asiste. Las verrugas no desaparecern sin tratamiento. Generalmente es necesario repetir el tratamiento para eliminarlas. En general, an despus de que las pruebas fsicas de las verrugas hayan desaparecido, el tejido normal subyacente sigue infectado.   No intente tratar las verrugas genitales con el mismo medicamento que se utiliza para las verrugas de las manos. Este tipo de medicamento es muy fuerte y puede quemar la piel de la zona genital, ocasionando ms daos.   Infrmele a sus parejas sexuales con las que haya mantenido relaciones antes del tratamiento, que usted sufre de verrugas genitales. Podran tambin estar infectados y necesitar tratamiento.   Evite el contacto sexual mientras realiza el tratamiento.   No frote o rasque las verrugas. Esta es una infeccin viral y puede diseminarse a otras partes del organismo (auto inoculacin).   Las mujeres que sufren de verrugas genitales debern hacer un control para prevenir el cncer cervical (Papanicolau) al menos una vez al ao. Este tipo de cncer es de crecimiento lento y puede curarse si se detecta en una etapa temprana. Las posibilidades de desarrollar cncer cervical aumentan en las mujeres que han sufrido HPV.   Informe a su obstetra en caso de embarazo. Este virus puede transmitirse al tracto respiratorio del beb. Convrselo con el profesional que lo asiste.   Siempre use condones en sus relaciones sexuales. Luego del tratamiento, use preservativos para prevenir la reinfeccin.   Para la picazn, puede usar medicamentos de venta libre; consulte con su mdico antes de utilizarlos.  SOLICITE   ATENCIN MDICA SI:  La piel de la zona tratada se vuelve roja, se hincha o duele.   Tiene fiebre.   Siente un malestar  generalizado.   Siente pequeos bultos (como granos) alrededor de la zona genital.   Tiene dolor o hemorragias durante las relaciones sexuales.  EST SEGURO QUE:   Comprende las instrucciones para el alta mdica.   Controlar su enfermedad.   Solicitar atencin mdica de inmediato segn las indicaciones.  Document Released: 02/15/2005 Document Revised: 04/27/2011 [employer] Patient Information 2012 [employer]. 

## 2012-01-19 ENCOUNTER — Encounter: Payer: Self-pay | Admitting: Gynecology

## 2012-01-29 ENCOUNTER — Ambulatory Visit: Payer: BC Managed Care – PPO | Admitting: Gynecology

## 2012-02-02 ENCOUNTER — Ambulatory Visit: Payer: BC Managed Care – PPO | Admitting: Gynecology

## 2012-02-05 ENCOUNTER — Ambulatory Visit: Payer: BC Managed Care – PPO | Admitting: Gynecology

## 2012-02-22 ENCOUNTER — Encounter: Payer: Self-pay | Admitting: Gynecology

## 2012-02-22 ENCOUNTER — Ambulatory Visit (INDEPENDENT_AMBULATORY_CARE_PROVIDER_SITE_OTHER): Payer: BC Managed Care – PPO | Admitting: Gynecology

## 2012-02-22 VITALS — BP 120/82

## 2012-02-22 DIAGNOSIS — A630 Anogenital (venereal) warts: Secondary | ICD-10-CM

## 2012-02-22 MED ORDER — HYDROCORTISONE VALERATE 0.2 % EX CREA: TOPICAL_CREAM | Freq: Two times a day (BID) | CUTANEOUS | Status: DC

## 2012-02-22 NOTE — Progress Notes (Signed)
Patient was seen in the office on October 3 complaining of 2 lesion that she noted in the lower aspect of her vaginal area. She has stated that her partner had noted some gross in the area of the penis as well. Her husband has had a vasectomy. On that office visit the following was noted:  The area of the fourchette there were 2 verrucus-like growths on each side of the introitus. The rest of the external genitalia vagina and cervix and fornix with no lesion seen.   Both lesions were completely excised and submitted for histological evaluation she was to return to the office in one week for followup but did not the final pathology report as follows:  Diagnosis 1. 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 Vulva, biopsy, fourchette, left - SEBORRHEIC KERATOSIS WITH FEATURES OF A CONDYLOMA ACUMINATUM  Pelvic exam today: Bartholin urethra Skene was within normal limits Vagina: No lesions or discharge Cervix: No lesions or discharge Perineum no lesions noted Perirectal region no lesions noted.  The 2 areas that the condyloma acuminatum were excised which were located inferior to both labia majora is near the fourchette looks like it's completely healed although a slight red halo was noted and this area was treated with 50% TCA. I've given her the name of a urologist for her husband to followup. We will otherwise see her next year for her annual exam or when necessary. Of note her last Pap smear was in 2012 which was normal. Literature information Spanish and condyloma acuminatum was provided.

## 2012-02-22 NOTE — Patient Instructions (Signed)
Verrugas genitales (Genital Warts)  Las verrugas genitales son Anita Parker enfermedad de transmisin sexual. Pueden aparecer como pequeos bultos en los tejidos de la zona genital.  CAUSAS Las verrugas genitales ests ocasionadas por un virus denominado VPH (virus del papiloma humano) Es la ms frecuente entre las enfermedades de transmisin sexual e infeccin de los rganos sexuales. Esta infeccin se disemina por Art gallery manager sexuales sin proteccin con una persona infectada. Puede transmitirse practicando sexo vaginal, anal u oral. Muchas personas no saben que estn infectadas. Pueden padecer la infeccin durante aos, y manifestarse pocos o ningn sntoma (problema) Tambin pueden transmitir la infeccin a sus parejas sin saberlo. SNTOMAS  Picazn e irritacin de la zona genital.   Verrugas que sangran.   Relaciones sexuales dolorosas.  DIAGNSTICO [employer] se reconocen a simple vista en la vagina, en la vulva, el perineo, el ano y el recto. Algunas pruebas pueden diagnosticar las verrugas genitales:  Papanicolau.   Examen de Lauris Poag de tejido biopsia.   Colposcopa Se trata de un instrumento que aumenta el tamao de lo que se observa al examinar la vagina y el cuello uterino. Al aplicar ciertas soluciones, las clulas de VPH cambian de color.  TRATAMIENTO Las verrugas pueden eliminarse del siguiente modo:   Aplicando sustancias qumicas como cantaridina o podofilina.   Nitrgeno lquido congelante (crioterapia).   Inmunoterapia con inyecciones de cndida o tricofiton.   Tratamiento con lser.   Quemarlas a Annette Stable de una sonda con electricidad (electrocauterizacin).   Inyecciones de interfern.   Ciruga.  PREVENCIN La vacuna contra el VPH puede ayudar a prevenir la infeccin por VPH que causa verrugas genitales y cncer del cuello uterino. Se recomienda administrar la vacuna a personas de entre 9 y 26 aos de edad. La vacuna puede no funcionar correctamente o no  hacer efecto en absoluto si usted ya tiene VPH. Estas vacunas no deben administrarse a mujeres embarazadas. INSTRUCCIONES PARA EL CUIDADO DOMICILIARIO  Es importante seguir las indicaciones del profesional que la asiste. Las verrugas no desaparecern sin TEFL teacher. Generalmente es necesario repetir el tratamiento para eliminarlas. En general, an despus de que las pruebas fsicas de las verrugas hayan desaparecido, el tejido normal subyacente sigue infectado.   No intente tratar las verrugas genitales con el mismo medicamento que se [place] para las verrugas de las manos. Este tipo de medicamento es muy fuerte y puede quemar la piel de la zona genital, ocasionando ms daos.   Infrmele a sus parejas sexuales con las que [place] mantenido relaciones antes del tratamiento, que usted sufre de Civil Service fast streamer. Podran tambin estar infectados y Network engineer.   Evite el contacto sexual mientras IT sales professional.   No frote o rasque las verrugas. Esta es una infeccin viral y puede diseminarse a otras partes del organismo (auto inoculacin).   Las mujeres que sufren de verrugas genitales debern hacer un control para Engineer, manufacturing systems cervical (Papanicolau) al menos una vez al ao. Este tipo de cncer es de crecimiento lento y puede curarse si se detecta en una etapa temprana. Las posibilidades de Higher education careers adviser en las mujeres que han sufrido HPV.   Informe a su obstetra en caso de embarazo. Este virus puede transmitirse al tracto respiratorio del beb. Convrselo con el profesional que lo asiste.   Siempre use condones en sus relaciones sexuales. Luego del tratamiento, use preservativos para Immunologist.   Para la picazn, puede usar medicamentos de venta libre; consulte con su mdico antes de Youth worker.  SOLICITE  ATENCIN MDICA SI:  La piel de la zona tratada se vuelve roja, se hincha o duele.   Tiene fiebre.   Siente un Advertising copywriter.   Siente pequeos bultos (como granos) alrededor de la zona genital.   Licensed conveyancer o hemorragias durante las relaciones sexuales.  EST SEGURO QUE:   Comprende las instrucciones para el alta mdica.   Controlar su enfermedad.   Solicitar atencin mdica de inmediato segn las indicaciones.  Document Released: 02/15/2005 Document Revised: 04/27/2011 [facility] Patient Information 2012 [place], [place].

## 2012-08-26 ENCOUNTER — Ambulatory Visit (INDEPENDENT_AMBULATORY_CARE_PROVIDER_SITE_OTHER): Payer: BC Managed Care – PPO | Admitting: Internal Medicine

## 2012-08-26 ENCOUNTER — Encounter: Payer: Self-pay | Admitting: Internal Medicine

## 2012-08-26 VITALS — BP 108/72 | HR 73 | Temp 98.1°F | Wt 149.0 lb

## 2012-08-26 DIAGNOSIS — IMO0002 Reserved for concepts with insufficient information to code with codable children: Secondary | ICD-10-CM

## 2012-08-26 DIAGNOSIS — S43409A Unspecified sprain of unspecified shoulder joint, initial encounter: Secondary | ICD-10-CM | POA: Insufficient documentation

## 2012-08-26 DIAGNOSIS — S43401A Unspecified sprain of right shoulder joint, initial encounter: Secondary | ICD-10-CM

## 2012-08-26 MED ORDER — CYCLOBENZAPRINE HCL 10 MG PO TABS: 10.0000 mg | ORAL_TABLET | Freq: Every evening | ORAL | Status: DC | PRN

## 2012-08-26 MED ORDER — NAPROXEN 500 MG PO TABS: 500.0000 mg | ORAL_TABLET | Freq: Two times a day (BID) | ORAL | Status: DC | PRN

## 2012-08-26 NOTE — Patient Instructions (Addendum)
Tome naproxeno 2 veces al dia con las comidas solo cuando IT sales professional. Le puede causar gastritis, deje de tomarlo si tiene dolor de estomago, nausea . Tome flexeril una pastilla en la noche, le va a dar suen~o [place] va a International aid/development worker los musculos pongase una toalla (en el sitio del Engineer, mining) con agua mas o menos  caliente todas las noches llame si no se siente mejor in 2 semanas

## 2012-08-26 NOTE — Assessment & Plan Note (Signed)
Sprain, trapezoid. Symptoms consistent with a sprain of the trapezoid area, neurological exam is normal, no neck pain per se. Plan: Naproxen, Flexeril, local heat. Plan and side effects discuss in Spanish, see instructions. If not better, she will call in 2 weeks, will refer her to a chiropractor.

## 2012-08-26 NOTE — Progress Notes (Signed)
  Subjective:    Patient ID: Anita Parker, female    DOB: [birth year], 32 y.o.   MRN: [id number]  HPI Acute visit One month history of pain in the right trapezoid area, gradual onset, now almost a steady pain, worse with neck flexion. Pain somehow radiates anteriorly to the chest Has not taken any medications for this condition.  Past Medical History  Diagnosis Date  . MOLE 04/04/2010  . Birth control     husband's vasectomy  . Condyloma acuminatum of vulva    Past Surgical History  Procedure Laterality Date  . No past surgeries     History   Social History  . Marital Status: Single    Spouse Name: N/A    Number of Children: 3  . 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 Smoking status: Never Smoker   . Smokeless tobacco: Never Used  . Alcohol Use: No  . Drug Use: No  . Sexually Active: Yes     Comment: patient's partner with vasectomy   Other Topics Concern  . Not on file   Social History Narrative   [place] 4 children, 3 living ones     Review of Systems Denies fever, chills, cough or weight loss. No injury or accident. Does have a physical job. No upper extremity paresthesias.     Objective:   Physical Exam BP 108/72  Pulse 73  Temp(Src) 98.1 F (36.7 C) (Oral)  Wt 149 lb (67.586 kg)  BMI 25.56 kg/m2  SpO2 99% General -- alert, well-developed, no apparent distress, healthy appearing. .   Neck --no tender to palpation at the cervical spine, range of motion is normal, complained of pain with flexion of the neck. Extremities-- no pretibial edema bilaterally Neurologic-- alert & oriented X3; DTRs and strength normal in all extremities. Speech and gait normal. Psych-- Cognition and judgment appear intact. Alert and cooperative with normal attention span and concentration.  not anxious appearing and not depressed appearing.       Assessment & Plan:

## 2014-01-19 ENCOUNTER — Encounter: Payer: BC Managed Care – PPO | Admitting: Internal Medicine

## 2014-03-23 ENCOUNTER — Encounter: Payer: Self-pay | Admitting: Internal Medicine

## 2015-04-30 ENCOUNTER — Ambulatory Visit (INDEPENDENT_AMBULATORY_CARE_PROVIDER_SITE_OTHER): Payer: BLUE CROSS/BLUE SHIELD | Admitting: Women's Health

## 2015-04-30 ENCOUNTER — Encounter: Payer: Self-pay | Admitting: Women's Health

## 2015-04-30 ENCOUNTER — Other Ambulatory Visit (HOSPITAL_COMMUNITY)
Admission: RE | Admit: 2015-04-30 | Discharge: 2015-04-30 | Disposition: A | Discharge status: Home / Self Care | Payer: Self-pay | Source: Ambulatory Visit | Attending: Women's Health | Admitting: Women's Health

## 2015-04-30 VITALS — BP 122/80 | Ht 62.0 in | Wt 161.0 lb

## 2015-04-30 DIAGNOSIS — N3000 Acute cystitis without hematuria: Secondary | ICD-10-CM | POA: Diagnosis not present

## 2015-04-30 DIAGNOSIS — Z01419 Encounter for gynecological examination (general) (routine) without abnormal findings: Secondary | ICD-10-CM | POA: Diagnosis not present

## 2015-04-30 DIAGNOSIS — B373 Candidiasis of vulva and vagina: Secondary | ICD-10-CM | POA: Diagnosis not present

## 2015-04-30 DIAGNOSIS — Z1329 Encounter for screening for other suspected endocrine disorder: Secondary | ICD-10-CM

## 2015-04-30 DIAGNOSIS — Z1151 Encounter for screening for human papillomavirus (HPV): Secondary | ICD-10-CM | POA: Insufficient documentation

## 2015-04-30 DIAGNOSIS — B3731 Acute candidiasis of vulva and vagina: Secondary | ICD-10-CM

## 2015-04-30 DIAGNOSIS — R1030 Lower abdominal pain, unspecified: Secondary | ICD-10-CM | POA: Diagnosis not present

## 2015-04-30 LAB — CBC WITH DIFFERENTIAL/PLATELET
BASOS ABS: 0 10*3/uL (ref 0.0–0.1)
BASOS PCT: 0 % (ref 0–1)
EOS PCT: 1 % (ref 0–5)
Eosinophils Absolute: 0.1 10*3/uL (ref 0.0–0.7)
HCT: 39.2 % (ref 36.0–46.0)
Hemoglobin: 13.3 g/dL (ref 12.0–15.0)
Lymphocytes Relative: 34 % (ref 12–46)
Lymphs Abs: 2.3 10*3/uL (ref 0.7–4.0)
MCH: 29.9 pg (ref 26.0–34.0)
MCHC: 33.9 g/dL (ref 30.0–36.0)
MCV: 88.1 fL (ref 78.0–100.0)
MONO ABS: 0.4 10*3/uL (ref 0.1–1.0)
MPV: 9.9 fL (ref 8.6–12.4)
Monocytes Relative: 6 % (ref 3–12)
Neutro Abs: 4 10*3/uL (ref 1.7–7.7)
Neutrophils Relative %: 59 % (ref 43–77)
PLATELETS: 212 10*3/uL (ref 150–400)
RBC: 4.45 MIL/uL (ref 3.87–5.11)
RDW: 12.9 % (ref 11.5–15.5)
WBC: 6.8 10*3/uL (ref 4.0–10.5)

## 2015-04-30 LAB — URINALYSIS W MICROSCOPIC + REFLEX CULTURE
Bilirubin Urine: NEGATIVE
Casts: NONE SEEN [LPF]
Crystals: NONE SEEN [HPF]
Glucose, UA: NEGATIVE
Ketones, ur: NEGATIVE
Nitrite: NEGATIVE
PH: 6 (ref 5.0–8.0)
PROTEIN: NEGATIVE
Specific Gravity, Urine: 1.015 (ref 1.001–1.035)

## 2015-04-30 LAB — COMPREHENSIVE METABOLIC PANEL
ALBUMIN: 4 g/dL (ref 3.6–5.1)
ALK PHOS: 43 U/L (ref 33–115)
ALT: 17 U/L (ref 6–29)
AST: 18 U/L (ref 10–30)
BUN: 9 mg/dL (ref 7–25)
CHLORIDE: 105 mmol/L (ref 98–110)
CO2: 25 mmol/L (ref 20–31)
CREATININE: 0.66 mg/dL (ref 0.50–1.10)
Calcium: 9.2 mg/dL (ref 8.6–10.2)
Glucose, Bld: 81 mg/dL (ref 65–99)
POTASSIUM: 3.8 mmol/L (ref 3.5–5.3)
SODIUM: 138 mmol/L (ref 135–146)
TOTAL PROTEIN: 7.1 g/dL (ref 6.1–8.1)
Total Bilirubin: 0.3 mg/dL (ref 0.2–1.2)

## 2015-04-30 LAB — WET PREP FOR TRICH, YEAST, CLUE
Clue Cells Wet Prep HPF POC: NONE SEEN
TRICH WET PREP: NONE SEEN

## 2015-04-30 LAB — TSH: TSH: 1.651 u[IU]/mL (ref 0.350–4.500)

## 2015-04-30 MED ORDER — SULFAMETHOXAZOLE-TRIMETHOPRIM 800-160 MG PO TABS: 1.0000 | ORAL_TABLET | Freq: Two times a day (BID) | ORAL | Status: DC

## 2015-04-30 MED ORDER — FLUCONAZOLE 150 MG PO TABS: 150.0000 mg | ORAL_TABLET | Freq: Once | ORAL | Status: DC

## 2015-04-30 NOTE — Patient Instructions (Signed)
lamisil over the counter cream for husband Mantenimiento de la salud - Mujeres (Health Maintenance, Female) Un estilo de vida saludable y los cuidados preventivos pueden favorecer considerablemente a la salud y Musician. Pregunte a su mdico cul es el cronograma de exmenes peridicos apropiado para usted. Esta es una buena oportunidad para consultarlo sobre cmo prevenir enfermedades y [place] sano. Adems de los controles, hay muchas otras cosas que puede hacer usted mismo. Los expertos han realizado numerosas investigaciones [employer]vin[employer]itor cambios en el estilo de vida y las medidas de prevencin que, [place], lo ayudarn a mantenerse sano. Solicite a su mdico ms informacin. EL PESO Y LA DIETA  Consuma una dieta saludable.  Asegrese de [employer] Stores verduras, frutas, productos lcteos de bajo contenido de [place] y Advertising account planner.  No consuma muchos alimentos de alto contenido de grasas slidas, azcares agregados o sal.  Realice actividad fsica con regularidad. Esta es una de las prcticas ms importantes que puede hacer por su salud.  La mayora de los adultos deben hacer ejercicio durante al menos 163mnutos por semana. El ejercicio debe aumentar la frecuencia cardaca y pActorla transpiracin (ejercicio de iStuart.  La mayora de los adultos tambin deben hField seismologistejercicios de elongacin al mToysRusveces a la semana. Agregue esto al su plan de ejercicio de intensidad moderada. Mantenga un peso saludable.  El ndice de masa corporal ([place] es una medida que puede utilizarse para identificar posibles problemas de pLuis Llorons Torres Proporciona una estimacin de la grasa corporal basndose en el peso y la altura. Su mdico puede ayudarle a dRadiation protection practitionerIAuburny a lScientist, forensico mTheatre managerun peso saludable.  Para las mujeres de 20aos o ms:  Un IVa [facility] Divisionmenor de 18,5 se considera bajo peso.  Un IBaptist Health Floydentre 18,5 y 24,9 es normal.  Un INorthern Westchester Facility Project LLCentre 25 y 29,9 se considera  sobrepeso.  Un IMC de 30 o ms se considera obesidad. Observe los niveles de colesterol y lpidos en la sangre.  Debe comenzar a rEnglish as a second language teacherde lpidos y cResearch officer, trade unionen la sangre a los 20aos y luego repetirlos cada 57aos  Es posible que nAutomotive engineerlos niveles de colesterol con mayor frecuencia si:  Sus niveles de lpidos y colesterol son altos.  Es mayor de 50aos.  Presenta un alto riesgo de padecer enfermedades cardacas. DETECCIN DE CNCER  Cncer de pulmn  Se recomienda realizar exmenes de deteccin de cncer de pulmn a personas adultas entre 549y 836aos que estn en riesgo de dHorticulturist, commercialde pulmn por sus antecedentes de consumo de tabaco.  Se recomienda una tomografa computarizada de baja dosis de los pLiberty Mediaaos a las personas que:  Fuman actualmente.  Hayan dejado el hbito en algn momento en los ltimos 15aos.  Hayan fumado durante 30aos un paquete diario. Un paquete-ao equivale a fumar un promedio de un paquete de cigarrillos diario durante un ao.  Los exmenes de deteccin anuales deben continuar hasta que hayan pasado 15aos desde que dej de fumar.  Ya no debern realizarse si tiene un problema de salud que le impida recibir tratamiento para eScience writerde pulmn. Cncer de mama  Practique la autoconciencia de la mama. Esto significa reconocer la apariencia normal de sus mamas y cmo las siente.  Tambin significa realizar autoexmenes regulares de lJohnson & Johnson Informe a su mdico sobre cualquier cambio, sin importar cun pequeo sea.  Si tiene entre 20 y 334aos, un mdico debe realizarle un examen clnico de las mBrunswick Corporation  parte del examen regular de salud, cada 1 a 3aos.  Si tiene 40aos o ms, debe Information systems manager clnico de las Microsoft. Tambin considere realizarse una [place] ([place]) todos los [place]son.  Si tiene antecedentes familiares de cncer de mama, hable con su  mdico para someterse a un estudio gentico.  Si tiene alto riesgo de Chief Financial Officer de mama, hable con su mdico para someterse a Public house manager y [employer].  La evaluacin del gen del cncer de mama (BRCA) se recomienda a mujeres que tengan familiares con cnceres relacionados con el BRCA. Los cnceres relacionados con el BRCA incluyen los siguientes:  Mama.  Ovario.  Trompas.  Cnceres de peritoneo.  Los resultados de la evaluacin determinarn la necesidad de asesoramiento gentico y de [place] de BRCA1 y BRCA2. Cncer de cuello del tero El mdico puede recomendarle que se haga pruebas peridicas de deteccin de cncer de los rganos de la pelvis (ovarios, tero y vagina). Estas pruebas incluyen un examen plvico, que abarca controlar si se produjeron cambios microscpicos en la superficie del cuello del tero (prueba de Papanicolaou). Pueden recomendarle que se haga estas pruebas cada 3aos, a partir de los 21aos.  A las mujeres que tienen entre 30 y 72aos, los mdicos pueden recomendarles que se sometan a exmenes plvicos y pruebas de Papanicolaou cada 68aos, o a la prueba de Papanicolaou y el examen plvico en combinacin con estudios de deteccin del virus del papiloma humano (VPH) cada 5aos. Algunos tipos de VPH aumentan el riesgo de Chief Financial Officer de cuello del tero. La prueba para la deteccin del VPH tambin puede realizarse a mujeres de cualquier edad cuyos resultados de la prueba de Papanicolaou no sean claros.  Es posible que otros mdicos no recomienden exmenes de deteccin a mujeres no embarazadas que se consideran sujetos de bajo riesgo de Chief Financial Officer de pelvis y que no tienen sntomas. Pregntele al mdico si un examen plvico de deteccin es adecuado para usted.  Si ha recibido un tratamiento para Science writer cervical o una enfermedad que podra causar cncer, necesitar realizarse una prueba de Papanicolaou y controles durante al  menos 28 aos de concluido el [place]. Si no se ha hecho el Papanicolaou con regularidad, debern volver a evaluarse los factores de riesgo (como tener un nuevo compaero sexual), para Teacher, adult education si debe realizarse los estudios nuevamente. Algunas mujeres sufren problemas mdicos que aumentan la probabilidad de Museum/gallery curator cncer de cuello del tero. En estos casos, el mdico podr QUALCOMM se realicen controles y pruebas de Papanicolaou con ms frecuencia. Cncer colorrectal  Este tipo de cncer puede detectarse y a menudo prevenirse.  Por lo general, los estudios de rutina se deben Medical laboratory scientific officer a Field seismologist a Proofreader de los 29 aos y [place] 94 aos.  Sin embargo, el mdico podr aconsejarle que lo haga antes, si tiene factores de riesgo para el cncer de colon.  Tambin puede recomendarle que use un kit de prueba para Hydrologist en la materia fecal.  Es posible que se use una pequea cmara en el extremo de un tubo para examinar directamente el colon (sigmoidoscopia o colonoscopia) a fin de Hydrographic surveyor formas tempranas de cncer colorrectal.  Los exmenes de rutina generalmente comienzan a los 49aos.  El examen directo del colon se debe repetir cada 5 a 10aos hasta los 75aos. Sin embargo, es posible que se realicen exmenes con mayor frecuencia, si se detectan formas tempranas de plipos precancerosos o  pequeos bultos. Cncer de piel  Revise la piel de la cabeza a los pies con regularidad.  Informe a su mdico si aparecen nuevos lunares o los que tiene se modifican, especialmente en su forma y color.  Tambin notifique al mdico si tiene un lunar que es ms grande que el tamao de una goma de lpiz.  Siempre use pantalla solar. Aplique pantalla solar de Kerry Dory y repetida a lo largo del Training and development officer.  Protjase usando mangas y [employer] Service[employer] Company, un sombrero de ala ancha y gafas para el sol, siempre que se encuentre en el exterior. ENFERMEDADES CARDACAS, DIABETES E HIPERTENSIN  ARTERIAL   La hipertensin arterial causa enfermedades cardacas y [place] el riesgo de ictus. La hipertensin arterial es ms probable en los siguientes casos:  Las personas que tienen la presin arterial en el extremo del rango normal (100-139/85-89 mm Hg).  Las personas con sobrepeso u obesidad.  Las Retail banker.  Si usted tiene entre 18 y 39 aos, debe medirse la presin arterial cada 3 a 5 aos. Si usted tiene 40 aos o ms, debe medirse la presin arterial Hewlett[employer]ard. Debe medirse la presin arterial dos veces: una vez cuando est en un hospital o una clnica y la otra vez cuando est en otro sitio. Registre el promedio de [employer] Stores. Para controlar su presin arterial cuando no est en un hospital o [place], puede usar lo siguiente:  Ardelia Mems mquina automtica para medir la presin arterial en una farmacia.  Un monitor para medir la presin arterial en el hogar.  Si tiene entre 72 y 22 aos, consulte a su mdico si debe tomar aspirina para prevenir el ictus.  Realcese exmenes de deteccin de la diabetes con regularidad. Esto incluye la toma de [place] de sangre para controlar el nivel de azcar en la sangre durante el [place].  Si tiene un peso normal y un bajo riesgo de padecer diabetes, realcese este anlisis cada tres aos despus de los 45aos.  Si tiene sobrepeso y un alto riesgo de padecer diabetes, considere someterse a este anlisis antes o con mayor frecuencia. PREVENCIN DE INFECCIONES  HepatitisB  Si tiene un riesgo ms alto de Museum/gallery curator hepatitis B, debe someterse a un examen de deteccin de este virus. Se considera que tiene un alto riesgo de Museum/gallery curator hepatitis B si:  Naci en un pas donde la hepatitis B es frecuente. Pregntele a su mdico qu pases son considerados de Public affairs consultant.  Sus padres nacieron en un pas de alto riesgo y usted no recibi una vacuna que lo proteja contra la hepatitis B (vacuna contra la hepatitis B).  [place].  [place] agujas para inyectarse drogas.  Vive con alguien que tiene hepatitis B.  Ha tenido sexo con alguien que tiene hepatitis B.  Recibe tratamiento de hemodilisis.  Toma ciertos medicamentos para el cncer, trasplante de rganos y afecciones autoinmunitarias. Hepatitis C  Se recomienda un anlisis de [place] para:  Todos los que nacieron entre 1945 y [phone].  Todas las personas que tengan un riesgo de haber contrado hepatitis C. Enfermedades de transmisin sexual (ETS).  Debe realizarse pruebas de deteccin de enfermedades de transmisin sexual (ETS), incluidas gonorrea y clamidia si:  Es sexualmente activo y es menor de 24aos.  Es mayor de 24aos, y Investment banker, operational informa que corre riesgo de tener este tipo de infecciones.  La actividad sexual ha cambiado desde que le hicieron la ltima prueba de deteccin y tiene un riesgo  mayor de tener clamidia o gonorrea. Pregntele al mdico si usted tiene riesgo.  Si no tiene el VIH, pero corre riesgo de infectarse por el virus, se recomienda tomar diariamente un medicamento recetado para evitar la infeccin. Esto se conoce como profilaxis previa a la exposicin. Se considera que est en riesgo si:  Es [place] sexualmente y no [place] preservativos habitualmente o no conoce el estado del VIH de sus Advertising copywriter.  Se inyecta drogas.  Es [place] sexualmente con Ardelia Mems pareja que tiene VIH. Consulte a su mdico para saber si tiene un alto riesgo de infectarse por el VIH. Si opta por comenzar la profilaxis previa a la exposicin, primero debe realizarse anlisis de deteccin del VIH. Luego, le harn anlisis cada 50mses mientras est tomando los medicamentos para la profilaxis previa a la exposicin.  [facility] [place]  Si es premenopusica y puede quedar e[place] solicite a su mdico asesoramiento previo a la concepcin.  Si puede quedar embarazada, tome 400 a [id number](mcg) de cido fAnheuser-Busch  Si desea evitar el  embarazo, hable con su mdico sobre el control de la natalidad (anticoncepcin). OSTEOPOROSIS Y MENOPAUSIA   La osteoporosis es una enfermedad en la que los huesos pierden los minerales y la fuerza por el avance de la edad. El resultado pueden ser fracturas graves en los hP[place] El riesgo de osteoporosis puede identificarse con uArdelia Memsprueba de densidad sea.  Si tiene 65aos o ms, o si est en riesgo de sufrir osteoporosis y fracturas, pregunte a su mdico si debe someterse a exmenes.  Consulte a su mdico si debe tomar un suplemento de calcio o de vitamina D para reducir el riesgo de osteoporosis.  La menopausia puede presentar ciertos sntomas fsicos y rGaffer  La terapia de reemplazo hormonal puede reducir algunos de estos sntomas y rGaffer Consulte a su mdico para saber si la terapia de reemplazo hormonal es conveniente para usted.  INSTRUCCIONES PARA EL CUIDADO EN EL HOGAR   Realcese los estudios de rutina de la salud, dentales y de lPublic librarian  MJauca  No consuma ningn producto que contenga tabaco, lo que incluye cigarrillos, tabaco de mHigher education careers advisero cPsychologist, sport and exercise  Si est embarazada, no beba alcohol.  Si est amamantando, reduzca el consumo de alcohol y la frecuencia con la que consume.  Si es mujer y no est embarazada limite el consumo de alcohol a no ms de 1 medida por da. Una medida equivale a 12onzas de cerveza, 5onzas de vino o 1onzas de bebidas alcohlicas de alta graduacin.  No consuma drogas.  No comparta agujas.  Solicite ayuda a su mdico si necesita apoyo o informacin para abandonar las drogas.  Informe a su mdico si a menudo se siente deprimido.  Notifique a su mdico si alguna vez ha sido vctima de abuso o si no se siente seguro en su hogar.   Esta informacin no tiene cMarine scientistel consejo del mdico. Asegrese de hacerle al mdico cualquier pregunta que tenga.   Document Released: 04/27/2011  Document Revised: 05/29/2014 Elsevier Interactive Patient Education 2016 EReynolds American Human Papillomavirus Quadrivalent Vaccine suspension for injection Qu es este medicamento? La VScience Applications InternationalCONTRA EL VIRUS DEL PAPILOMA HUMANO es una vacuna. Se utiliza para prevenir infecciones de cuatro tipos de virus del papiloma humano. En mujeres, la vacuna puede disminuir su riesgo de desarrollar cncer cervical, vaginal, vulvar, o anal y verrugas genitales. En hombres, la vacuna puede disminuir su riesgo de verrugas genitales y cncer  anal. No puede contraer estas enfermedades de esta vacuna. Este medicamento no trata [employer]. Este medicamento puede ser utilizado para otros usos; si tiene alguna pregunta consulte con su proveedor de atencin mdica o con su farmacutico. Qu le debo informar a mi profesional de la salud antes de tomar este medicamento? Necesita saber si usted presenta alguno de los siguientes problemas o situaciones: -fiebre o infeccin -hemofilia -infeccin por VIH o SIDA -problemas del sistema inmunolgico -conteos bajos de plaquetas -una reaccin alrgica o inusual a la vacuna contra el virus del papiloma humano, a la levadura, a otros medicamentos, alimentos, colorantes o conservantes -si est embarazada o buscando quedar embarazada -si est amamantando a un beb Cmo debo utilizar este medicamento? Esta vacuna se inyecta en el msculo en la parte superior del brazo o en el muslo. La administra un profesional de [employer] Home	[place]. Debe ser supervisado por 15 minutos despus de recibir cada dosis. A veces, puede desmayarse despus de recibir la vacuna. Es posible que le pidan que se siente o se acueste durante los 15 minutos. Se administran tres dosis. La segunda dosis se administra 2 meses de recibir la primera dosis. La ltima dosis se administra 4 meses despus de recibir la segunda dosis. Recibir una copia de informacin escrita sobre la vacuna antes de cada vacuna. Asegrese de  leer este folleto cada vez cuidadosamente. Este folleto puede cambiar con frecuencia. Hable con su pediatra para informarse acerca del uso de este medicamento en nios. Aunque este medicamento ha sido recetado a nios tan menores como de 9 aos de edad para condiciones selectivas, las precauciones se aplican. Sobredosis: Pngase en contacto inmediatamente con un centro toxicolgico o una sala de urgencia si usted cree que haya tomado demasiado medicamento. ATENCIN: ConAgra Foods es solo para usted. No comparta este medicamento con nadie. Qu sucede si me olvido de una dosis? Todas las 3 dosis de esta vacuna deben ser administradas dentro de 6 meses. Recuerde de mantener todas las citas para las dosis siguientes. Su proveedor de Museum/gallery curator cuando necesita volver para su prxima dosis. Consulte a su profesional de la salud por asesoramiento si no puede asistir a una cita o si se olvida una dosis programada. Qu puede interactuar con este medicamento? -otras vacunas Puede ser que esta lista no menciona todas las posibles interacciones. Informe a su profesional de KB Home	[place] de AES Corporation productos a base de hierbas, medicamentos de [place] o suplementos nutritivos que est tomando. Si usted fuma, consume bebidas alcohlicas o si utiliza drogas ilegales, indqueselo tambin a su profesional de KB Home	[place]. Algunas sustancias pueden interactuar con su medicamento. A qu debo estar atento al usar Coca-[employer]a? Es posible que esta vacuna no proteja completamente a todos. Contine a realizarse exmenes plvicos y del cncer cervical o anal de Geographical information systems officer regular como le haya indicado su mdico. El virus del papiloma humano es una enfermedad de transmisin sexual. Se puede pasar por cualquier actividad sexual que consiste de contacto genital. La vacuna acta mejor cuando se administra antes de tener contacto con el virus. La State Farm de las personas que tienen el virus no muestran signos ni  sntomas ningunos. Si presenta una reaccin o sntoma inusual despus de recibir la vacuna, informe a su mdico o su profesional de KB Home	[place]. Qu efectos secundarios puedo tener al Mas[employer] Corporation este medicamento? Efectos secundarios que debe informar a su mdico o a Barrister's clerk de la salud tan pronto como sea posible: -reacciones alrgicas como erupcin cutnea, picazn  o urticarias, hinchazn de la cara, labios o lengua -problemas respiratorios -sensacin de desmayos o cadas Efectos secundarios que, por lo general, no requieren atencin mdica (debe informarlos a su mdico o a su profesional de la salud si persisten o si son molestos): -tos -mareos -fiebre -dolor de cabeza -nusea -enrojecimiento, calor, hinchazn, dolor o picazn en el lugar de la inyeccin Puede ser que esta lista no menciona todos los posibles efectos secundarios. Comunquese a su mdico por asesoramiento mdico Humana Inc. Usted puede informar los efectos secundarios a la FDA por telfono al 1-800-FDA-1088. Dnde debo guardar mi medicina? Este medicamento se administra en hospitales o clnicas y no necesitar guardarlo en su domicilio. ATENCIN: Este folleto es un resumen. Puede ser que no cubra toda la posible informacin. Si usted tiene preguntas acerca de esta medicina, consulte con su mdico, su farmacutico o su profesional de Technical sales engineer.    2016, Elsevier/Gold Standard. (2014-07-01 00:00:00) Vaginitis monilisica (Monilial Vaginitis) La vaginitis es una inflamacin (irritacin, hinchazn) de la vagina y la vulva. Esta no es una enfermedad de transmisin sexual.  CAUSAS Este tipo de vaginitis lo causa un hongo (candida) que normalmente se encuentra en la vagina. El hongo candida se ha desarrollado hasta el punto de ocasionar problemas en el equilibrio qumico. SNTOMAS  Secrecin vaginal espesa y blanca.  Hinchazn, picazn, enrojecimiento e inflamacin de la vagina y en algunos casos de los  labios vaginales (vulva).  Ardor o dolor al Continental Airlines.  Dolor en [place]. DIAGNSTICO Los factores que favorecen la vaginitis moniliasica son:  Kyla Balzarine de virginidad y postmenopusicas.  Embarazo.  Infecciones.  Sentir cansancio, estar enferma o estresada, especialmente si ya ha sufrido este problema en el pasado.  Diabetes Buen control ayudar a disminur la probabilidad.  Pldoras anticonceptivas  Ropa interior [place].  El uso de espumas de bao, aerosoles femeninos duchas vaginales o tampones con desodorante.  Algunos antibiticos (medicamentos que destruyen grmenes).  Si contrae alguna enfermedad puede sufrir recurrencias espordicas. [place] profesional que lo asiste prescribir medicamentos.  Hay diferentes tipos de cremas y supositorios vaginales que tratan especficamente la vaginitis monilisica. Para infecciones por hongos recurrentes, utilice un supositorio o crema en la vagina dos veces por semana, o segn se le indique.  Tambin podrn utilizarse cremas con corticoides o anti monilisicas para la picazn o la irritacin de la vulva. Consulte con el profesional que la asiste.  Si la crema no da resultado, podr aplicarse en la vagina una solucin con azul de metileno.  El consumo de yogur puede prevenir este tipo de vaginitis. INSTRUCCIONES [place] todos los medicamentos tal como se le indic.  No mantenga relaciones sexuales hasta que el tratamiento se haya completado, o segn las indicaciones del profesional que la asiste.  Tome baos de asiento tibios.  No se aplique duchas vaginales.  No utilice tampones, especialmente los perfumados.  Use ropa interior de algodn  Anheuser-Busch pantalones ajustados y las medias tipo panty.  Comunique a sus compaeros sexuales que sufre una infeccin por hongos. Ellos deben concurrir para un control mdico si tienen sntomas como una urticaria leve o picazn.  Sus  compaeros sexuales deben tratarse tambin si la infeccin es difcil de Radiographer, therapeutic.  Practique el sexo seguro - use condones  Algunos medicamentos vaginales ocasionan fallas en los condones de ltex. Los medicamentos vaginales que pueden daar los condones son:  Rica Records cleocina  Butoconazole (Femstat)  Terconazole (Terazol) supositorios vaginales  Miconazole (Monistat) (es  un medicamento de venta libre) SOLICITE ATENCIN MDICA SI:  Usted tiene una temperatura oral de ms de 38,9 C (102 F).  Si la infeccin empeora luego de 2 das de tratamiento.  Si la infeccin no mejora luego de 3 das de tratamiento.  Aparecen ampollas en o alrededor de la vagina.  Si aparece una hemorragia vaginal y no es el momento del perodo.  Siente dolor al [employer] Airlines.  Presenta problemas intestinales.  Tiene dolor durante las [employer].   Esta informacin no tiene Marine scientist el consejo del mdico. Asegrese de hacerle al mdico cualquier pregunta que tenga.   Document Released: 02/15/2005 Document Revised: 07/31/2011 Elsevier Interactive Patient Education [employer].

## 2015-04-30 NOTE — Progress Notes (Signed)
Anita Parker 12/15/1980 [id number]    History:    Presents for annual exam and problem.  Regular monthly cycles/vasectomy. Normal Pap history. Having low abdominal pelvic pressure, pain with urination, vaginal discharge with itching for the past week. 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 Parents healthy.  ROS:  A ROS was performed and pertinent positives and negatives are included.  Exam:  Filed Vitals:   04/30/15 1047  BP: 122/80    General appearance:  Normal Thyroid:  Symmetrical, normal in size, without palpable masses or nodularity. Respiratory  Auscultation:  Clear without wheezing or rhonchi Cardiovascular  Auscultation:  Regular rate, without rubs, murmurs or gallops  Edema/varicosities:  Not grossly evident Abdominal  Soft,nontender, without masses, guarding or rebound.  Liver/spleen:  No organomegaly noted  Hernia:  None appreciated  Skin  Inspection:  Grossly normal   Breasts: Examined lying and sitting.     Right: Without masses, retractions, discharge or axillary adenopathy.     Left: Without masses, retractions, discharge or axillary adenopathy. Gentitourinary   Inguinal/mons:  Normal without inguinal adenopathy  External genitalia:  Normal  BUS/Urethra/Skene's glands:  Normal  Vagina:  Erythematous, moderate amount of a white discharge wet prep positive for many yeast  Cervix:  Normal  Uterus:  normal in size, shape and contour.  Midline and mobile  Adnexa/parametria:     Rt: Without masses or tenderness.   Lt: Without masses or tenderness.  Anus and perineum: Normal  Digital rectal exam: Normal sphincter tone without palpated masses or tenderness UA: +1 Leukocytes, 10-20 WBCs, many bacteria  Assessment/Plan:  34 y.o. MHF G3 P3 for annual exam.    UTI Yeast vaginitis Monthly  cycle/vasectomy  Plan: Urine culture pending. Septra twice daily for 3 days #6 prescription, proper use given and reviewed. Instructed to call if pelvic discomfort does not resolve after antibiotic we'll get an ultrasound. Diflucan 150 by mouth times one dose with refill. Yeast prevention discussed. SBE's, annual screening mammogram at 40, calcium rich diet, vitamin D 1000 daily encouraged. CBC, TSH, CMP, UA, Pap with HR HPV typing. New screening guidelines reviewed.    [place], 5:00 PM 04/30/2015

## 2015-05-03 LAB — CYTOLOGY - PAP

## 2015-05-04 LAB — URINE CULTURE: Colony Count: 35000

## 2015-06-04 ENCOUNTER — Encounter: Payer: Self-pay | Admitting: Internal Medicine

## 2015-06-04 ENCOUNTER — Ambulatory Visit (INDEPENDENT_AMBULATORY_CARE_PROVIDER_SITE_OTHER): Payer: BLUE CROSS/BLUE SHIELD | Admitting: Internal Medicine

## 2015-06-04 VITALS — BP 118/74 | HR 77 | Temp 98.1°F | Ht 62.0 in | Wt 163.0 lb

## 2015-06-04 DIAGNOSIS — J069 Acute upper respiratory infection, unspecified: Secondary | ICD-10-CM | POA: Diagnosis not present

## 2015-06-04 MED ORDER — AZELASTINE HCL 0.1 % NA SOLN: 2.0000 | Freq: Every evening | NASAL | Status: DC | PRN

## 2015-06-04 NOTE — Patient Instructions (Signed)
Decanse, tome muchos liquidos, tylenol si lo necesita  mucinex DM 1 pastilla dos veces al dia para la tos  flonase (OTC) :  2 sprays a cada lado de la nariz en la man~ana hasta que se mejore  Astelin (con receta): 2 sprays a cada lado de la nariz en la noche hasta que se mejore  llame si no esta mejorando en los siguientes 10 dias

## 2015-06-04 NOTE — Progress Notes (Signed)
   Subjective:    Patient ID: Anita Parker, female    DOB: 05-19-1981, 35 y.o.   MRN: [id number]  DOS:  06/04/2015 Type of visit - description : Acute visit Interval history: Symptoms started 2 weeks ago with cough, minimal amount of sputum production, sore throat. Not really taking any medication at this point.   Review of Systems  Mild subjective fever for few days, mild chills. Had a runny nose but that has improved Had sinus pain and congestion but that also has improved. Additionally, reports occasionally feeling bloated after eating, nausea, vomiting, diarrhea, abdominal pain or blood in the stools.  Past Medical History  Diagnosis Date  . MOLE 04/04/2010  . Birth control     husband's vasectomy  . Condyloma acuminatum of vulva     Past Surgical History  Procedure Laterality Date  . No past surgeries      Social History   Social History  . Marital Status: Married    Spouse Name: N/A  . Number of Children: 3  . 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 Smoking status: Never Smoker   . Smokeless tobacco: Never Used  . Alcohol Use: No  . Drug Use: No  . Sexual Activity: Yes     Comment: patient's partner with vasectomy, INTERCOURSE AGE 89, SEXUAL PARTNERS LESS THAN 5   Other Topics Concern  . Not on file   Social History Narrative   From [place], 4 children, 3 living ones        Medication List       This list is accurate as of: 06/04/15  4:50 PM.  Always use your most recent med list.               azelastine 0.1 % nasal spray  Commonly known as:  ASTELIN  Place 2 sprays into both nostrils at bedtime as needed for rhinitis. Use in each nostril as directed           Objective:   Physical Exam BP 118/74 mmHg  Pulse 77  Temp(Src) 98.1 F (36.7 C) (Oral)  Ht 5\' 2"  (1.575 m)  Wt 163 lb (73.936 kg)  BMI 29.81 kg/m2  SpO2 98%  LMP 03/29/2015 (Exact Date) General:    Well developed, well nourished . NAD.  HEENT:  Normocephalic . Face symmetric, atraumatic. Nose is slightly congested, sinuses no TTP, throat normal. TMs normal. Lungs:  CTA B Normal respiratory effort, no intercostal retractions, no accessory muscle use. Heart: RRR,  no murmur.  No pretibial edema bilaterally  Skin: Not pale. Not jaundice Neurologic:  alert & oriented X3.  Speech normal, gait appropriate for age and unassisted Psych--  Cognition and judgment appear intact.  Cooperative with normal attention span and concentration.  Behavior appropriate. No anxious or depressed appearing.      Assessment & Plan:   Assessment Mole 2011 Condyloma, vulvar Birth control, husband's vasectomy  PLAN URI with persisting, mild respiratory symptoms. Conservative treatment with Mucinex, Flonase, Astelin. All instructions discussed in Spanish. Will call if not improving soon

## 2015-06-04 NOTE — Progress Notes (Signed)
Pre visit review using our clinic review tool, if applicable. No additional management support is needed unless otherwise documented below in the visit note. 

## 2015-08-17 ENCOUNTER — Ambulatory Visit (INDEPENDENT_AMBULATORY_CARE_PROVIDER_SITE_OTHER): Payer: BLUE CROSS/BLUE SHIELD | Admitting: Women's Health

## 2015-08-17 ENCOUNTER — Encounter: Payer: Self-pay | Admitting: Women's Health

## 2015-08-17 VITALS — BP 122/80 | Ht 62.0 in | Wt 163.0 lb

## 2015-08-17 DIAGNOSIS — N760 Acute vaginitis: Secondary | ICD-10-CM

## 2015-08-17 DIAGNOSIS — A499 Bacterial infection, unspecified: Secondary | ICD-10-CM | POA: Diagnosis not present

## 2015-08-17 DIAGNOSIS — N898 Other specified noninflammatory disorders of vagina: Secondary | ICD-10-CM

## 2015-08-17 DIAGNOSIS — B9689 Other specified bacterial agents as the cause of diseases classified elsewhere: Secondary | ICD-10-CM

## 2015-08-17 DIAGNOSIS — R350 Frequency of micturition: Secondary | ICD-10-CM

## 2015-08-17 LAB — URINALYSIS W MICROSCOPIC + REFLEX CULTURE
BILIRUBIN URINE: NEGATIVE
CRYSTALS: NONE SEEN [HPF]
Casts: NONE SEEN [LPF]
GLUCOSE, UA: NEGATIVE
KETONES UR: NEGATIVE
Nitrite: NEGATIVE
PH: 6 (ref 5.0–8.0)
PROTEIN: NEGATIVE
Specific Gravity, Urine: 1.02 (ref 1.001–1.035)
Yeast: NONE SEEN [HPF]

## 2015-08-17 LAB — WET PREP FOR TRICH, YEAST, CLUE: Trich, Wet Prep: NONE SEEN

## 2015-08-17 MED ORDER — METRONIDAZOLE 500 MG PO TABS: 500.0000 mg | ORAL_TABLET | Freq: Two times a day (BID) | ORAL | Status: DC

## 2015-08-17 MED ORDER — FLUCONAZOLE 150 MG PO TABS: ORAL_TABLET | ORAL | Status: DC

## 2015-08-17 NOTE — Patient Instructions (Signed)
Vaginitis monilisica (Monilial Vaginitis) La vaginitis es una inflamacin (irritacin, hinchazn) de la vagina y la vulva. Esta no es una enfermedad de transmisin sexual.  CAUSAS Este tipo de vaginitis lo causa un hongo (candida) que normalmente se encuentra en la vagina. El hongo candida se ha desarrollado hasta el punto de ocasionar problemas en el equilibrio qumico. SNTOMAS  Secrecin vaginal espesa y blanca.  Hinchazn, picazn, enrojecimiento e inflamacin de la vagina y en algunos casos de los labios vaginales (vulva).  Ardor o dolor al [employer].  Dolor en [place]. DIAGNSTICO Los factores que favorecen la vaginitis moniliasica son:  Kyla Balzarine de virginidad y postmenopusicas.  Embarazo.  Infecciones.  Sentir cansancio, estar enferma o estresada, especialmente si ya ha sufrido este problema en el pasado.  Diabetes Buen control ayudar a disminur la probabilidad.  Pldoras anticonceptivas  Ropa interior [place].  El uso de espumas de bao, aerosoles femeninos duchas vaginales o tampones con desodorante.  Algunos antibiticos (medicamentos que destruyen grmenes).  Si contrae alguna enfermedad puede sufrir recurrencias espordicas. [place] profesional que lo asiste prescribir medicamentos.  Hay diferentes tipos de cremas y supositorios vaginales que tratan especficamente la vaginitis monilisica. Para infecciones por hongos recurrentes, utilice un supositorio o crema en la vagina dos veces por semana, o segn se le indique.  Tambin podrn utilizarse cremas con corticoides o anti monilisicas para la picazn o la irritacin de la vulva. Consulte con el profesional que la asiste.  Si la crema no da resultado, podr aplicarse en la vagina una solucin con azul de metileno.  El consumo de yogur puede prevenir este tipo de vaginitis. INSTRUCCIONES [place] todos los medicamentos tal como se le indic.  No  mantenga relaciones sexuales hasta que el tratamiento se haya completado, o segn las indicaciones del profesional que la asiste.  Tome baos de asiento tibios.  No se aplique duchas vaginales.  No utilice tampones, especialmente los perfumados.  Use ropa interior de algodn  [employer] pantalones ajustados y las medias tipo panty.  Comunique a sus compaeros sexuales que sufre una infeccin por hongos. Ellos deben concurrir para un control mdico si tienen sntomas como una urticaria leve o picazn.  Sus compaeros sexuales deben tratarse tambin si la infeccin es difcil de Radiographer, therapeutic.  Practique el sexo seguro - use condones  Algunos medicamentos vaginales ocasionan fallas en los condones de ltex. Los medicamentos vaginales que pueden daar los condones son:  Building services engineer cleocina  Butoconazole (Femstat)  Terconazole (Terazol) supositorios vaginales  Miconazole (Monistat) (es un medicamento de venta libre) SOLICITE ATENCIN MDICA SI:  Waldron Session tiene una temperatura oral de ms de 38,9 C (102 F).  Si la infeccin empeora luego de 2 das de tratamiento.  Si la infeccin no mejora luego de 3 das de tratamiento.  Aparecen ampollas en o alrededor de la vagina.  Si aparece una hemorragia vaginal y no es el momento del perodo.  Siente dolor al [employer].  Presenta problemas intestinales.  Tiene dolor durante las [employer].   Esta informacin no tiene Marine scientist el consejo del mdico. Asegrese de hacerle al mdico cualquier pregunta que tenga.   Document Released: 02/15/2005 Document Revised: 07/31/2011 Elsevier Interactive Patient Education 2016 [place] bacteriana (Bacterial Vaginosis) La vaginosis bacteriana es una infeccin vaginal que perturba el equilibrio normal de las bacterias que se encuentran en la vagina. Es el resultado de un crecimiento excesivo de ciertas bacterias. Esta es la infeccin vaginal ms frecuente en  mujeres en edad  reproductiva. El tratamiento es importante para prevenir complicaciones, especialmente en mujeres embarazadas, dado que puede causar un parto prematuro. CAUSAS  La vaginosis bacteriana se origina por un aumento de bacterias nocivas que, generalmente, estn presentes en cantidades ms pequeas en la vagina. Varios tipos diferentes de bacterias pueden causar esta afeccin. Sin embargo, la causa de su desarrollo no se comprende totalmente. [place] o comportamientos pueden exponerlo a un mayor riesgo de desarrollar vaginosis bacteriana, entre los que se incluyen:  Tener una nueva pareja sexual o mltiples parejas sexuales.  Las duchas vaginales  El uso del DIU (dispositivo intrauterino) como mtodo anticonceptivo. El contagio no se produce en baos, por ropas de cama, en piscinas o por contacto con objetos. SIGNOS Y SNTOMAS  Algunas mujeres que padecen vaginosis bacteriana no presentan signos ni sntomas. Los sntomas ms comunes son:  Secrecin vaginal de color grisceo.  Secrecin vaginal con olor similar al [employer], especialmente despus de Retail banker.  Picazn o sensacin de ardor en la vagina o la vulva.  Ardor o dolor al [employer]. DIAGNSTICO  Su mdico analizar su historia clnica y le examinar la vagina para detectar signos de vaginosis bacteriana. Puede tomarle Truddie Coco de flujo vaginal. Su mdico examinar esta muestra con un microscopio para controlar las bacterias y clulas anormales. Tambin puede realizarse un anlisis del pH vaginal.  TRATAMIENTO  La vaginosis bacteriana puede tratarse con antibiticos, en forma de comprimidos o de crema vaginal. Puede indicarse una segunda tanda de antibiticos si la afeccin se repite despus del tratamiento. Debido a que la vaginosis bacteriana aumenta el riesgo de contraer enfermedades de transmisin sexual, el tratamiento puede ayudar a reducir el riesgo de clamidia, [place], VIH y  herpes. [place] solo medicamentos de venta libre o recetados, segn las indicaciones del mdico.  Si le han recetado antibiticos, tmelos como se le indic. Asegrese de que finaliza la prescripcin completa aunque se sienta mejor.  Comunique a sus compaeros sexuales que sufre una infeccin vaginal. Deben consultar a su mdico y recibir tratamiento si tienen problemas, como picazn o una erupcin cutnea leve.  Durante el [place], es importante que siga estas indicaciones:  Visual merchandiser relaciones sexuales o use preservativos de la forma correcta.  No se haga duchas vaginales.  Evite consumir alcohol como se lo haya indicado el mdico.  Community education officer se lo haya indicado el mdico. SOLICITE ATENCIN MDICA SI:   Sus sntomas no mejoran despus de 3 das de [place].  Aumenta la secrecin o Conservation officer, historic buildings.  Tiene fiebre. ASEGRESE DE QUE:   Comprende estas instrucciones.  Controlar su afeccin.  Recibir ayuda de inmediato si no mejora o si empeora. PARA OBTENER MS INFORMACIN  Centros para el control y la prevencin de Probation officer for Disease Control and Prevention, CDC): AppraiserFraud.fi Asociacin Estadounidense de la Salud Sexual (American Sexual Health Association, SHA): www.ashastd.org    Esta informacin no tiene Marine scientist el consejo del mdico. Asegrese de hacerle al mdico cualquier pregunta que tenga.   Document Released: 08/15/2007 Document Revised: 05/29/2014 Elsevier Interactive Patient Education [employer].

## 2015-08-17 NOTE — Progress Notes (Signed)
Patient ID: Anita Parker, female   DOB: 30-Jun-1980, 35 y.o.   MRN: [id number] Interpreter present/Blanca. Presents with complaint of increased vaginal discharge with irritation, itching and occasional odor. Monthly cycles/vasectomy same partner. Denies urinary symptoms, back pain or fever.  Exam: Appears well. External genitalia extremely erythematous at introitus, speculum exam moderate amount of a curdy discharge, vaginal walls erythematous, wet prep positive for TNTC yeast, TNTC clue cells, TNTC bacteria. UA: Trace blood, +1 leukocytes, 10-20 WBCs, 3-10 RBCs, many bacteria, 20-40 squamous epithelials  Yeast vaginitis Bacteria vaginosis  Plan: Flagyl 500 twice daily for 7 days, alcohol precautions reviewed. Diflucan 150 by mouth today repeat in 3 days. Instructed to call if no relief of symptoms. Yeast prevention discussed. Urine culture pending.

## 2015-08-19 LAB — URINE CULTURE

## 2015-09-14 ENCOUNTER — Ambulatory Visit (INDEPENDENT_AMBULATORY_CARE_PROVIDER_SITE_OTHER): Payer: No Typology Code available for payment source | Admitting: Internal Medicine

## 2015-09-14 ENCOUNTER — Encounter: Payer: Self-pay | Admitting: Internal Medicine

## 2015-09-14 VITALS — BP 102/80 | HR 76 | Temp 98.4°F | Resp 16 | Ht 62.0 in | Wt 163.2 lb

## 2015-09-14 DIAGNOSIS — Z0000 Encounter for general adult medical examination without abnormal findings: Secondary | ICD-10-CM | POA: Diagnosis not present

## 2015-09-14 DIAGNOSIS — M25572 Pain in left ankle and joints of left foot: Secondary | ICD-10-CM

## 2015-09-14 DIAGNOSIS — Z09 Encounter for follow-up examination after completed treatment for conditions other than malignant neoplasm: Secondary | ICD-10-CM | POA: Insufficient documentation

## 2015-09-14 NOTE — Progress Notes (Signed)
Subjective:    Patient ID: Anita Parker, female    DOB: 1981-03-21, 35 y.o.   MRN: [id number]  DOS:  09/14/2015 Type of visit - description : CPX Interval history: She remains active at work and trying to eat healthy    Review of Systems Constitutional: No fever. No chills. No unexplained wt changes. No unusual sweats  HEENT: No dental problems, no ear discharge, no facial swelling, no voice changes. No eye discharge, no eye  redness , no  intolerance to light   Respiratory: No wheezing , no  difficulty breathing. No cough , no mucus production  Cardiovascular: No CP, no leg swelling , no  Palpitations  GI: no nausea, no vomiting, no diarrhea , no  abdominal pain.  No blood in the stools. No dysphagia, no odynophagia    Endocrine: No polyphagia, no polyuria , no polydipsia  GU: No dysuria, gross hematuria, difficulty urinating. No urinary urgency, no frequency.  Musculoskeletal: Had neck pain last month, no radiation, self resolved but now has pain at the right upper trapezoid area, again without radiation, on and off, worse with certain movements. Also complaining of left ankle pain, has noted she tends to supinate the ankle    Skin: No change in the color of the skin, palor , no  Rash  Allergic, immunologic: No environmental allergies , no  food allergies  Neurological: No dizziness no  syncope. No headaches. No diplopia, no slurred, no slurred speech, no motor deficits, no facial  Numbness  Hematological: No enlarged lymph nodes, no easy bruising , no unusual bleedings  Psychiatry: No suicidal ideas, no hallucinations, no beavior problems, no confusion.  No unusual/severe anxiety, no depression   Past Medical History  Diagnosis Date  . MOLE 04/04/2010  . Birth control     husband's vasectomy  . Condyloma acuminatum of vulva     Past Surgical History  Procedure Laterality Date  . No past surgeries      Social History   Social History  . Marital Status:  Married    Spouse Name: N/A  . Number of Children: 3  . 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 Smoking status: Never Smoker   . Smokeless tobacco: Never Used  . Alcohol Use: No  . Drug Use: No  . Sexual Activity: Yes     Comment: patient's partner with vasectomy, INTERCOURSE AGE 89, SEXUAL PARTNERS LESS THAN 5   Other Topics Concern  . Not on file   Social History Narrative   From [place], 4 children, 3 living ones   Household- pt, husband, 3 children     Family History  Problem Relation Age of Onset  . Cancer Maternal Grandmother     UTERINE  . Diabetes Neg Hx   . CAD Neg Hx   . Breast cancer Neg Hx   . Colon cancer Neg Hx        Medication List    Notice  As of 09/14/2015  6:03 PM   You have not been prescribed any medications.         Objective:   Physical Exam BP 102/80 mmHg  Pulse 76  Temp(Src) 98.4 F (36.9 C) (Oral)  Resp 16  Ht 5\' 2"  (1.575 m)  Wt 163 lb 4 oz (74.05 kg)  BMI 29.85 kg/m2  SpO2 99%  LMP 07/21/2015  General:   Well developed, well nourished .  NAD.  Neck: No  thyromegaly , normal carotid pulse HEENT:  Normocephalic . Face symmetric, atraumatic Lungs:  CTA B Normal respiratory effort, no intercostal retractions, no accessory muscle use. Echo: No TTP, full range of motion Heart: RRR,  no murmur.  No pretibial edema bilaterally  Abdomen:  Not distended, soft, non-tender. No rebound or rigidity.   Skin: Exposed areas without rash. Not pale. Not jaundice Neurologic:  alert & oriented X3.  Speech normal, gait appropriate for age and unassisted Strength symmetric and appropriate for age.  DTRs symmetric MSK: Left ankle with  tendency to  hypersupinate + Varicose veins, left leg, no phlebitis Psych: Cognition and judgment appear intact.  Cooperative with normal attention span and concentration.  Behavior appropriate. No anxious or depressed appearing.     Assessment & Plan:   Assessment Mole 2011 Condyloma, vulvar Birth control, husband's vasectomy Varicose veins   PLAN  Trapezoid pain: No  radicular features, recommend stretching before work, tylenol motrin, call if  not better Varicose veins: explained potential to get worse, recommend to get compression stockings L ankle pain , excessive supination, will refer to sports medicine, shoe inserts ? RTC 6 months

## 2015-09-14 NOTE — Assessment & Plan Note (Signed)
Trapezoid pain: No  radicular features, recommend stretching before work, tylenol motrin, call if  not better Varicose veins: explained potential to get worse, recommend to get compression stockings L ankle pain , excessive supination, will refer to sports medicine, shoe inserts ? RTC 6 months

## 2015-09-14 NOTE — Progress Notes (Signed)
Pre visit review using our clinic review tool, if applicable. No additional management support is needed unless otherwise documented below in the visit note/SLS  

## 2015-09-14 NOTE — Patient Instructions (Signed)
GO TO THE LAB :      Get the blood work     GO TO THE FRONT DESK Schedule your next appointment for a  Physical exam in 1 year    comprese las medias de compresion  La vamos a mandar donde el "sports medicine" para el dolor del tobillo izquierdo

## 2015-09-14 NOTE — Assessment & Plan Note (Addendum)
Td 2011 Female care-- per gyn, Dr Toney Rakes Never had a cscope  Previous labs reviewed, they are okay, check a FLP Diet and exercise discussed

## 2015-09-15 LAB — LIPID PANEL
CHOLESTEROL: 167 mg/dL (ref 0–200)
HDL: 56.4 mg/dL (ref >39.00)
LDL Cholesterol: 95 mg/dL (ref 0–99)
NonHDL: 110.18
Total CHOL/HDL Ratio: 3
Triglycerides: 78 mg/dL (ref 0.0–149.0)
VLDL: 15.6 mg/dL (ref 0.0–40.0)

## 2015-09-16 ENCOUNTER — Encounter: Payer: Self-pay | Admitting: Family Medicine

## 2015-09-16 ENCOUNTER — Ambulatory Visit (INDEPENDENT_AMBULATORY_CARE_PROVIDER_SITE_OTHER): Payer: No Typology Code available for payment source | Admitting: Family Medicine

## 2015-09-16 VITALS — BP 111/74 | HR 83 | Ht 64.0 in | Wt 163.0 lb

## 2015-09-16 DIAGNOSIS — M25572 Pain in left ankle and joints of left foot: Secondary | ICD-10-CM | POA: Diagnosis not present

## 2015-09-16 NOTE — Progress Notes (Signed)
PCP and consultation requested by: Kathlene November, MD  Subjective:   HPI: Patient is a 35 y.o. female here for left ankle pain.  Interpreter here for visit. Patient reports she's had about 5 months of lateral left ankle pain. Associated with turning outwards (supination) of her foot. Has some swelling. Pain is 0/10 now but can be sharp. Has not tried anything to date for this. No prior issues. No numbness, skin changes.  Past Medical History  Diagnosis Date  . MOLE 04/04/2010  . Birth control     husband's vasectomy  . Condyloma acuminatum of vulva     No current outpatient prescriptions on file prior to visit.   No current facility-administered medications on file prior to visit.    Past Surgical History  Procedure Laterality Date  . No past surgeries      No Known Allergies  Social History   Social History  . Marital Status: Married    Spouse Name: N/A  . Number of Children: 3  . 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 Smoking status: Never Smoker   . Smokeless tobacco: Never Used  . Alcohol Use: No  . Drug Use: No  . Sexual Activity: Yes     Comment: patient's partner with vasectomy, INTERCOURSE AGE 24, SEXUAL PARTNERS LESS THAN 5   Other Topics Concern  . Not on file   Social History Narrative   From [place], 4 children, 3 living ones   Household- pt, husband, 3 children    Family History  Problem Relation Age of Onset  . Cancer Maternal Grandmother     UTERINE  . Diabetes Neg Hx   . CAD Neg Hx   . Breast cancer Neg Hx   . Colon cancer Neg Hx     BP 111/74 mmHg  Pulse 83  Ht 5\' 4"  (1.626 m)  Wt 163 lb (73.936 kg)  BMI 27.97 kg/m2  LMP 07/21/2015  Review of Systems: See HPI above.    Objective:  Physical Exam:  Gen: NAD, comfortable in exam room  Left ankle: No gross deformity, swelling, ecchymoses.  Mild pes planus.  On ambulation she supinates however and  walks on lateral aspect of foot. FROM with 5/5 strength - pain on external rotation. TTP minimal peroneal tendons.  No other tenderness. Negative ant drawer and talar tilt.   Negative syndesmotic compression. Thompsons test negative. NV intact distally.    Right ankle: FROM without pain.  Assessment & Plan:  1. Left ankle pain - consistent with mild subluxation when ambulating, peroneal tendinitis on exam.  Reassured patient.  ASO, icing, ibuprofen.  Shown home exercises to do daily.  Sports insoles also provided to help with neutral stance.  F/u in 4-6 weeks.

## 2015-09-16 NOTE — Assessment & Plan Note (Signed)
consistent with mild subluxation when ambulating, peroneal tendinitis on exam.  Reassured patient.  ASO, icing, ibuprofen.  Shown home exercises to do daily.  Sports insoles also provided to help with neutral stance.  F/u in 4-6 weeks.

## 2015-09-16 NOTE — Patient Instructions (Signed)
You have a mild ankle subluxation and peroneal tendinitis. Ice the area 15 minutes at a time 3-4 times a day as needed for pain, swelling. Ibuprofen 600mg  three times a day with food may help with pain and inflammation. Ankle brace when up and walking around for support. Do home exercises 3 sets of 10 once a day. Arch supports (green inserts) in your shoes will help with support and for you to walk more normally. Follow up with me in 4-6 weeks.

## 2015-10-26 ENCOUNTER — Encounter: Payer: Self-pay | Admitting: Family Medicine

## 2015-10-26 ENCOUNTER — Ambulatory Visit (INDEPENDENT_AMBULATORY_CARE_PROVIDER_SITE_OTHER): Payer: No Typology Code available for payment source | Admitting: Family Medicine

## 2015-10-26 VITALS — BP 110/75 | HR 71 | Ht 64.0 in | Wt 151.0 lb

## 2015-10-26 DIAGNOSIS — M25572 Pain in left ankle and joints of left foot: Secondary | ICD-10-CM | POA: Diagnosis not present

## 2015-10-26 NOTE — Patient Instructions (Signed)
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 Make an appointment to come back for custom orthotics - make sure you let Willette Cluster know up front. Continue with ankle brace, the green insoles in the meantime. Icing, ibuprofen if needed for pain and swelling.

## 2015-10-27 NOTE — Assessment & Plan Note (Signed)
consistent with mild subluxation when ambulating, now with sinus tarsi syndrome.  No longer with pain peroneal tendons so this has since resolved.  Reassured patient.  ASO, icing, ibuprofen, sports insoles.  She will consider custom orthotics and make appointment for these.

## 2015-10-27 NOTE — Progress Notes (Signed)
PCP and consultation requested by: Kathlene November, MD  Subjective:   HPI: Patient is a 35 y.o. female here for left ankle pain.  4/27: Interpreter here for visit. Patient reports she's had about 5 months of lateral left ankle pain. Associated with turning outwards (supination) of her foot. Has some swelling. Pain is 0/10 now but can be sharp. Has not tried anything to date for this. No prior issues. No numbness, skin changes.  6/6: Patient reports she has improved since last visit. Pain at worst 5/10, anterolateral now, can be sharp. Using brace, insoles. Has been icing, takes ibuprofen if needed. Doing home exercises also. No numbness, skin changes. Worse with prolonged standing, walking.  Past Medical History  Diagnosis Date  . MOLE 04/04/2010  . Birth control     husband's vasectomy  . Condyloma acuminatum of vulva     No current outpatient prescriptions on file prior to visit.   No current facility-administered medications on file prior to visit.    Past Surgical History  Procedure Laterality Date  . No past surgeries      No Known Allergies  Social History   Social History  . Marital Status: Married    Spouse Name: N/A  . Number of Children: 3  . 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 Smoking status: Never Smoker   . Smokeless tobacco: Never Used  . Alcohol Use: No  . Drug Use: No  . Sexual Activity: Yes     Comment: patient's partner with vasectomy, INTERCOURSE AGE 41, SEXUAL PARTNERS LESS THAN 5   Other Topics Concern  . Not on file   Social History Narrative   From [place], 4 children, 3 living ones   Household- pt, husband, 3 children    Family History  Problem Relation Age of Onset  . Cancer Maternal Grandmother     UTERINE  . Diabetes Neg Hx   . CAD Neg Hx   . Breast cancer Neg Hx   . Colon cancer Neg Hx     BP 110/75 mmHg  Pulse 71  Ht 5\' 4"  (1.626 m)   Wt 151 lb (68.493 kg)  BMI 25.91 kg/m2  Review of Systems: See HPI above.    Objective:  Physical Exam:  Gen: NAD, comfortable in exam room  Left ankle: No gross deformity, swelling, ecchymoses.  Mild pes planus. FROM with 5/5 strength - pain on external rotation. TTP only at sinus tarsi now.  No other foot/ankle tenderness. Negative ant drawer and talar tilt.   Negative syndesmotic compression. Thompsons test negative. NV intact distally.    Right ankle: FROM without pain.  Assessment & Plan:  1. Left ankle pain - consistent with mild subluxation when ambulating, now with sinus tarsi syndrome.  No longer with pain peroneal tendons so this has since resolved.  Reassured patient.  ASO, icing, ibuprofen, sports insoles.  She will consider custom orthotics and make appointment for these.

## 2015-11-02 ENCOUNTER — Ambulatory Visit (INDEPENDENT_AMBULATORY_CARE_PROVIDER_SITE_OTHER): Payer: No Typology Code available for payment source | Admitting: Family Medicine

## 2015-11-02 ENCOUNTER — Encounter: Payer: Self-pay | Admitting: Family Medicine

## 2015-11-02 VITALS — BP 103/67 | HR 79 | Ht 64.0 in | Wt 151.0 lb

## 2015-11-02 DIAGNOSIS — M25572 Pain in left ankle and joints of left foot: Secondary | ICD-10-CM

## 2015-11-04 NOTE — Progress Notes (Signed)
PCP and consultation requested by: Kathlene November, MD  Subjective:   HPI: Patient is a 35 y.o. female here for left ankle pain.  4/27: Interpreter here for visit. Patient reports she's had about 5 months of lateral left ankle pain. Associated with turning outwards (supination) of her foot. Has some swelling. Pain is 0/10 now but can be sharp. Has not tried anything to date for this. No prior issues. No numbness, skin changes.  6/6: Patient reports she has improved since last visit. Pain at worst 5/10, anterolateral now, can be sharp. Using brace, insoles. Has been icing, takes ibuprofen if needed. Doing home exercises also. No numbness, skin changes. Worse with prolonged standing, walking.  6/13: Patient returns for custom orthotics. Pain 4/10 and anterolateral, sharp. No skin changes, numbness.  Past Medical History  Diagnosis Date  . MOLE 04/04/2010  . Birth control     husband's vasectomy  . Condyloma acuminatum of vulva     No current outpatient prescriptions on file prior to visit.   No current facility-administered medications on file prior to visit.    Past Surgical History  Procedure Laterality Date  . No past surgeries      No Known Allergies  Social History   Social History  . Marital Status: Married    Spouse Name: N/A  . Number of Children: 3  . 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 Smoking status: Never Smoker   . Smokeless tobacco: Never Used  . Alcohol Use: No  . Drug Use: No  . Sexual Activity: Yes     Comment: patient's partner with vasectomy, INTERCOURSE AGE 14, SEXUAL PARTNERS LESS THAN 5   Other Topics Concern  . Not on file   Social History Narrative   From [place], 4 children, 3 living ones   Household- pt, husband, 3 children    Family History  Problem Relation Age of Onset  . Cancer Maternal Grandmother     UTERINE  . Diabetes Neg Hx   . CAD  Neg Hx   . Breast cancer Neg Hx   . Colon cancer Neg Hx     BP 103/67 mmHg  Pulse 79  Ht 5\' 4"  (1.626 m)  Wt 151 lb (68.493 kg)  BMI 25.91 kg/m2  Review of Systems: See HPI above.    Objective:  Physical Exam:  Gen: NAD, comfortable in exam room  Left ankle: No gross deformity, swelling, ecchymoses.  Mild pes planus. FROM with 5/5 strength - pain on external rotation. TTP only at sinus tarsi.  No other foot/ankle tenderness. Negative ant drawer and talar tilt.   Negative syndesmotic compression. Thompsons test negative. NV intact distally.  Right ankle: FROM without pain.  Assessment & Plan:  1. Left ankle pain - consistent with mild subluxation when ambulating, now with sinus tarsi syndrome.  No longer with pain peroneal tendons so this has since resolved.  Reassured patient.  ASO, icing, ibuprofen.  Made custom orthotics today - felt more comfortable with these.  Patient was fitted for a : standard, cushioned, semi-rigid orthotic. The orthotic was heated and afterward the patient stood on the orthotic blank positioned on the orthotic stand. The patient was positioned in subtalar neutral position and 10 degrees of ankle dorsiflexion in a weight bearing stance. After completion of molding, a stable base was applied to the orthotic blank. The blank was ground to a stable position for  weight bearing. Size: 7 blue swirl Base: blue med density EVA Posting: none Additional orthotic padding: none Total prep time 40 minutes.

## 2015-11-04 NOTE — Assessment & Plan Note (Signed)
consistent with mild subluxation when ambulating, now with sinus tarsi syndrome.  No longer with pain peroneal tendons so this has since resolved.  Reassured patient.  ASO, icing, ibuprofen.  Made custom orthotics today - felt more comfortable with these.  Patient was fitted for a : standard, cushioned, semi-rigid orthotic. The orthotic was heated and afterward the patient stood on the orthotic blank positioned on the orthotic stand. The patient was positioned in subtalar neutral position and 10 degrees of ankle dorsiflexion in a weight bearing stance. After completion of molding, a stable base was applied to the orthotic blank. The blank was ground to a stable position for weight bearing. Size: 7 blue swirl Base: blue med density EVA Posting: none Additional orthotic padding: none Total prep time 40 minutes.

## 2015-11-30 ENCOUNTER — Ambulatory Visit (INDEPENDENT_AMBULATORY_CARE_PROVIDER_SITE_OTHER): Payer: No Typology Code available for payment source | Admitting: Family Medicine

## 2015-11-30 ENCOUNTER — Encounter: Payer: Self-pay | Admitting: Family Medicine

## 2015-11-30 VITALS — BP 103/68 | HR 72 | Ht 64.0 in | Wt 150.0 lb

## 2015-11-30 DIAGNOSIS — M25572 Pain in left ankle and joints of left foot: Secondary | ICD-10-CM | POA: Diagnosis not present

## 2015-12-01 NOTE — Assessment & Plan Note (Signed)
2/2 mild subluxation, sinus tarsi syndrome.  Much improved with orthotics.  Lateral heel wedge added to control side to side motion, posterior supination.  Continue home exercises.  Call us with any problems otherwise f/u prn.  Tylenol, motrin if needed.

## 2015-12-01 NOTE — Progress Notes (Signed)
PCP and consultation requested by: Kathlene November, MD  Subjective:   HPI: Patient is a 35 y.o. female here for left ankle pain.  4/27: Interpreter here for visit. Patient reports she's had about 5 months of lateral left ankle pain. Associated with turning outwards (supination) of her foot. Has some swelling. Pain is 0/10 now but can be sharp. Has not tried anything to date for this. No prior issues. No numbness, skin changes.  6/6: Patient reports she has improved since last visit. Pain at worst 5/10, anterolateral now, can be sharp. Using brace, insoles. Has been icing, takes ibuprofen if needed. Doing home exercises also. No numbness, skin changes. Worse with prolonged standing, walking.  6/13: Patient returns for custom orthotics. Pain 4/10 and anterolateral, sharp. No skin changes, numbness.  7/11: Patient reports she feels much better. Getting some pain dorsal right foot where shoe is pressing but overall pain is 0/10. Feels like left foot is turning outwards too much in shoe though. No skin changes, numbness.  Past Medical History  Diagnosis Date  . MOLE 04/04/2010  . Birth control     husband's vasectomy  . Condyloma acuminatum of vulva     No current outpatient prescriptions on file prior to visit.   No current facility-administered medications on file prior to visit.    Past Surgical History  Procedure Laterality Date  . No past surgeries      No Known Allergies  Social History   Social History  . Marital Status: Married    Spouse Name: N/A  . Number of Children: 3  . 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 Smoking status: Never Smoker   . Smokeless tobacco: Never Used  . Alcohol Use: No  . Drug Use: No  . Sexual Activity: Yes     Comment: patient's partner with vasectomy, INTERCOURSE AGE 82, SEXUAL PARTNERS LESS THAN 5   Other Topics Concern  . Not on file   Social  History Narrative   From [place], 4 children, 3 living ones   Household- pt, husband, 3 children    Family History  Problem Relation Age of Onset  . Cancer Maternal Grandmother     UTERINE  . Diabetes Neg Hx   . CAD Neg Hx   . Breast cancer Neg Hx   . Colon cancer Neg Hx     BP 103/68 mmHg  Pulse 72  Ht 5\' 4"  (1.626 m)  Wt 150 lb (68.04 kg)  BMI 25.73 kg/m2  Review of Systems: See HPI above.    Objective:  Physical Exam:  Gen: NAD, comfortable in exam room  Left ankle: Ambulation does have supination more on left - corrected with lateral heel wedge placement. No gross deformity, swelling, ecchymoses.  Mild pes planus. FROM with 5/5 strength, no pain. No tenderness now at sinus tarsi.  No other foot/ankle tenderness. Negative ant drawer and talar tilt.   Negative syndesmotic compression. Thompsons test negative. NV intact distally.  Right ankle: FROM without pain.  Assessment & Plan:  1. Left ankle pain - 2/2 mild subluxation, sinus tarsi syndrome.  Much improved with orthotics.  Lateral heel wedge added to control side to side motion, posterior supination.  Continue home exercises.  Call us with any problems otherwise f/u prn.  Tylenol, motrin if needed.

## 2016-02-18 ENCOUNTER — Encounter: Payer: Self-pay | Admitting: Internal Medicine

## 2016-02-18 ENCOUNTER — Ambulatory Visit (INDEPENDENT_AMBULATORY_CARE_PROVIDER_SITE_OTHER): Payer: No Typology Code available for payment source | Admitting: Internal Medicine

## 2016-02-18 VITALS — BP 108/68 | HR 68 | Temp 97.8°F | Resp 14 | Ht 64.0 in | Wt 161.4 lb

## 2016-02-18 DIAGNOSIS — R21 Rash and other nonspecific skin eruption: Secondary | ICD-10-CM

## 2016-02-18 MED ORDER — BETAMETHASONE DIPROPIONATE AUG 0.05 % EX CREA: TOPICAL_CREAM | Freq: Two times a day (BID) | CUTANEOUS | 0 refills | Status: DC

## 2016-02-18 MED ORDER — PREDNISONE 10 MG PO TABS: ORAL_TABLET | ORAL | 0 refills | Status: DC

## 2016-02-18 NOTE — Assessment & Plan Note (Signed)
Rash:  rash at the neck, pt does not recall using any new jemerly or cosmetic products. She feels is slightly fatigued and had subjective fever but there is no evidence of throat infection, thyroid is not enlarged or tender, she does not look toxic. Will treat as an allergic reaction, with topical and oral steroids. Continue with OTC antihistaminics by mouth. She will let me know if is not improving.  Also request a work excuse. Will do.

## 2016-02-18 NOTE — Patient Instructions (Signed)
apliquese la crema 2 veces al dia por una semana  Tome prednisona 2 pastillas juntas cada dia por 5 dias  llame si no se mejora en unos dias o si tiene fiebre, se pone peor

## 2016-02-18 NOTE — Progress Notes (Signed)
Subjective:    Patient ID: Anita Parker, female    DOB: 22-Feb-1981, 35 y.o.   MRN: [id number]  DOS:  02/18/2016 Type of visit - description : acute Interval history: Symptoms started 3 days ago with a rash on the neck, it spread to the other side and now is bilateral. No blisters but some itching. Is causing discomfort particularly when she uses the shirt at work. The area felt slightly tender. No other areas affected with a rash, specifically no itchy hands, palms, lips. No new exposures  Review of Systems  She also reports subjective fever. Some sore throat.   Past Medical History:  Diagnosis Date  . Birth control    husband's vasectomy  . Condyloma acuminatum of vulva   . MOLE 04/04/2010    Past Surgical History:  Procedure Laterality Date  . NO PAST SURGERIES      Social History   Social History  . Marital status: Married    Spouse name: N/A  . Number of children: 3  . Years of education: N/A   Occupational History  . [employer], cleaning [place]   Social History Main Topics  . Smoking status: Never Smoker  . Smokeless tobacco: Never Used  . Alcohol use No  . Drug use: No  . Sexual activity: Yes     Comment: patient's partner with vasectomy, INTERCOURSE AGE 75, SEXUAL PARTNERS LESS THAN 5   Other Topics Concern  . Not on file   Social History Narrative   From [place], 4 children, 3 living ones   Household- pt, husband, 3 children        Medication List       Accurate as of 02/18/16  1:06 PM. Always use your most recent med list.          [facility] PO Take 1 tablet by mouth daily as needed.          Objective:   Physical Exam BP 108/68 (BP Location: Left Arm, Patient Position: Sitting, Cuff Size: Normal)   Pulse 68   Temp 97.8 F (36.6 C) (Oral)   Resp 14   Ht 5\' 4"  (1.626 m)   Wt 161 lb 6 oz (73.2 kg)   LMP 12/21/2015 (Approximate)   SpO2 96%   BMI 27.70 kg/m  General:   Well developed,  well nourished . NAD.  HEENT:  Normocephalic . Face symmetric, atraumatic. No thyromegaly. Throat symmetric, no red. No discharge. Lungs:  CTA B Normal respiratory effort, no intercostal retractions, no accessory muscle use. Heart: RRR,  no murmur.  No pretibial edema bilaterally  Skin: Not pale. Not jaundice. Other than the neck, skin is normal. See picture. Neurologic:  alert & oriented X3.  Speech normal, gait appropriate for age and unassisted Psych--  Cognition and judgment appear intact.  Cooperative with normal attention span and concentration.  Behavior appropriate. No anxious or depressed appearing.        Assessment & Plan:  Assessment Mole 2011 Condyloma, vulvar Birth control, husband's vasectomy Varicose veins   PLAN  Rash:  rash at the neck, pt does not recall using any new jemerly or cosmetic products. She feels is slightly fatigued and had subjective fever but there is no evidence of throat infection, thyroid is not enlarged or tender, she does not look toxic. Will treat as an allergic reaction, with topical and oral steroids. Continue with OTC antihistaminics by mouth. She will let me know if is not improving.  Also request a work excuse. Will do.

## 2016-02-18 NOTE — Progress Notes (Signed)
Pre visit review using our clinic review tool, if applicable. No additional management support is needed unless otherwise documented below in the visit note. 

## 2016-05-01 ENCOUNTER — Encounter: Payer: BLUE CROSS/BLUE SHIELD | Admitting: Gynecology

## 2016-05-18 ENCOUNTER — Encounter: Payer: Self-pay | Admitting: Family Medicine

## 2016-05-18 ENCOUNTER — Ambulatory Visit (INDEPENDENT_AMBULATORY_CARE_PROVIDER_SITE_OTHER): Payer: No Typology Code available for payment source | Admitting: Family Medicine

## 2016-05-18 VITALS — BP 126/60 | HR 122 | Temp 99.4°F | Ht 64.0 in | Wt 166.0 lb

## 2016-05-18 DIAGNOSIS — J111 Influenza due to unidentified influenza virus with other respiratory manifestations: Secondary | ICD-10-CM | POA: Diagnosis not present

## 2016-05-18 DIAGNOSIS — J029 Acute pharyngitis, unspecified: Secondary | ICD-10-CM | POA: Diagnosis not present

## 2016-05-18 LAB — POC INFLUENZA A&B (BINAX/QUICKVUE)
INFLUENZA A, POC: NEGATIVE
Influenza B, POC: NEGATIVE

## 2016-05-18 MED ORDER — OSELTAMIVIR PHOSPHATE 75 MG PO CAPS: 75.0000 mg | ORAL_CAPSULE | Freq: Two times a day (BID) | ORAL | 0 refills | Status: AC

## 2016-05-18 MED ORDER — NAPROXEN 500 MG PO TABS: 500.0000 mg | ORAL_TABLET | Freq: Two times a day (BID) | ORAL | 0 refills | Status: DC

## 2016-05-18 NOTE — Patient Instructions (Addendum)
Gripe en los adultos °(Influenza, Adult) °La gripe es una infección viral que afecta principalmente las vías respiratorias. Las vías respiratorias incluyen órganos que ayudan a respirar, como los pulmones, la nariz y la garganta. La gripe provoca muchos síntomas del resfrío común, así como fiebre alta y dolor corporal. °Se transmite fácilmente de persona a persona (es contagiosa). La mejor manera de prevenir la gripe es aplicándose la vacuna contra la gripe todos los años. °CAUSAS °La gripe es causada por un virus. Se puede contagiar el virus de las siguientes maneras: °· Al aspirar las gotitas que una persona infectada elimina al toser o estornudar. °· Al tocar algo que fue recientemente contaminado con el virus y luego llevarse la mano a la boca, la nariz o los ojos. °FACTORES DE RIESGO °Los siguientes factores pueden hacer que usted sea propenso a contraer la gripe: °· No lavarse las manos frecuentemente con agua y jabón o un desinfectante de manos a base de alcohol. °· Tener contacto cercano con muchas personas durante la temporada de resfrío y gripe. °· Tocarse la boca, los ojos o la nariz sin lavarse ni desinfectarse las manos primero. °· No beber suficientes líquidos o no seguir una dieta saludable. °· No dormir lo suficiente o no hacer suficiente actividad física. °· Tener un alto grado de estrés. °· No colocarse la vacuna anual contra la gripe. °Puede correr un mayor riesgo de complicaciones de la gripe, como una infección pulmonar grave (neumonía) si: °· Tiene más de 65 años. °· Está embarazada. °· Tiene un sistema que combate las defensas (sistema inmunitario) debilitado. Puede tener un sistema inmunitario debilitado si: °? Tiene VIH o sida. °? Está recibiendo quimioterapia. °? Usa medicamentos que reducen la actividad (suprimen) del sistema inmunitario. °· Tiene una enfermedad a largo plazo (crónica), como cardiopatía coronaria, enfermedad renal, diabetes o enfermedad pulmonar. °· Tiene un trastorno  hepático. °· Son obesas. °· Tiene anemia. °SÍNTOMAS °Los síntomas de esta afección por lo general duran entre 4 y 10 días, y pueden tener los siguientes síntomas: °· Fiebre. °· Escalofríos. °· Dolor de cabeza, dolores en el cuerpo o dolores musculares. °· Dolor de garganta. °· Tos. °· Secreción o congestión nasal. °· Molestias en el pecho y tos. °· Pérdida del apetito. °· Debilidad o cansancio (fatiga). °· Mareos. °· Náuseas o vómitos. °DIAGNÓSTICO °Esta afección se puede diagnosticar en función de los antecedentes médicos y un examen físico. El médico puede indicarle un cultivo faríngeo o nasal para confirmar el diagnóstico. °TRATAMIENTO °Si la gripe se detecta de manera temprana, puede recibir tratamiento con medicamentos antivirales que pueden reducir la duración de la enfermedad y la gravedad de los síntomas. Este medicamento se puede administrar por vía oral o por vía intravenosa (IV), es decir, a través de un tubo que se coloca en una vena. °El objetivo del tratamiento es aliviar los síntomas cuidándose en su casa. Esto puede incluir usar medicamentos de venta libre, beber una gran cantidad de líquidos y agregar humedad al aire en su casa. °En algunos casos, la gripe se cura sin tratamiento. Los casos graves o las complicaciones de gripe se pueden tratar en un hospital. °INSTRUCCIONES PARA EL CUIDADO EN EL HOGAR °· Tome los medicamentos de venta libre y los recetados solamente como se lo haya indicado el médico. °· Use un humidificador de aire frío para agregar humedad al aire de su casa. Esto puede ayudarlo a respirar mejor. °· Descanse todo lo que sea necesario. °· Beba suficiente líquido para mantener la orina clara o de color   amarillo pálido. °· Al toser o estornudar, cúbrase la boca y la nariz. °· Lávese las manos con agua y jabón frecuentemente, en especial después de toser o estornudar. Use desinfectante para manos si no dispone de agua y jabón. °· Quédese en su casa y no concurra al trabajo o a la  escuela, como se lo haya indicado el médico. A menos que visite al médico, evite salir de su casa hasta que no tenga fiebre durante 24 horas sin el uso de medicamentos. °· Concurra a todas las visitas de control como se lo haya indicado el médico. Esto es importante. ° °PREVENCIÓN °· Aplicarse la vacuna anual contra la gripe es la mejor manera de evitar contagiarse la gripe. Puede aplicarse la vacuna contra la gripe a fines de verano, en otoño o en invierno. Pregúntele al médico cuándo debe aplicarse la vacuna contra la gripe. °· Lávese las manos o use un desinfectante de manos con frecuencia. °· Evite el contacto con personas que están enfermas durante la temporada de resfrío y gripe. °· Siga una dieta saludable, beba muchos líquidos, duerma lo suficiente y realice actividad física con regularidad. ° °SOLICITE ATENCIÓN MÉDICA SI: °· Presenta nuevos síntomas. °· Tiene los siguientes síntomas: °? Dolor en el pecho. °? Diarrea. °? Fiebre. °· La tos empeora. °· Produce más mucosidad. °· Siente náuseas o vomita. ° °SOLICITE ATENCIÓN MÉDICA DE INMEDIATO SI: °· Le falta el aire o tiene dificultad para respirar. °· La piel o las uñas se tornan de un color azulado. °· Presenta dolor intenso o rigidez de cuello. °· Le duele la cabeza de forma repentina o tiene dolor en la cara o el oído. °· No puede dejar de vomitar. ° °Esta información no tiene como fin reemplazar el consejo del médico. Asegúrese de hacerle al médico cualquier pregunta que tenga. °Document Released: 02/15/2005 Document Revised: 08/30/2015 Document Reviewed: 03/02/2015 °Elsevier Interactive Patient Education © 2017 [employer] ° °

## 2016-05-18 NOTE — Progress Notes (Signed)
Chief Complaint  Patient presents with  . Generalized Body Aches    Anita Parker here for URI complaints. Her daughter is interpreting.  Duration: 1 day  Associated symptoms: shortness of breath (feels like she can't take a deep breath), myalgia and fevers (102F), rhinorrhea, ST Denies: sinus congestion, itchy watery eyes, ear pain and ear drainage Treatment to date: Tylenol (took some before coming in) Sick contacts: No  ROS:  Const: +fevers HEENT: As noted in HPI Lungs: No cough  Past Medical History:  Diagnosis Date  . Birth control    husband's vasectomy  . Condyloma acuminatum of vulva   . MOLE 04/04/2010   Family History  Problem Relation Age of Onset  . Cancer Maternal Grandmother     UTERINE  . Diabetes Neg Hx   . CAD Neg Hx   . Breast cancer Neg Hx   . Colon cancer Neg Hx     BP 126/60   Pulse (!) 122   Temp 99.4 F (37.4 C) (Oral)   Ht 5\' 4"  (1.626 m)   Wt 166 lb (75.3 kg)   LMP 05/11/2016   SpO2 100%   BMI 28.49 kg/m  General: Awake, alert, appears stated age 35: AT, [place], ears patent b/l and TM's neg, nares patent w/o discharge, no sinus tenderness, pharynx erythematous and without exudates, MMM Neck: No masses or asymmetry Heart: Tachycardic, reg rhythm, no murmurs, no bruits Lungs: CTAB, no accessory muscle use Psych: Age appropriate judgment and insight, normal mood and affect  Influenza - Plan: oseltamivir (TAMIFLU) 75 MG capsule, CANCELED: POCT Rapid Influenza A&B  Sore throat - Plan: Culture, Group A Strep  Body aches - Plan: POC Influenza A&B(BINAX/QUICKVUE)  Orders as above. Will tx for flu empirically. She is within 48 hr window.  2/4 Cent[facility] criteria, given symptoms will culture as we are out of Rapid Strep tests. If positive, will treat. NSAID and Tylenol for aches and fever. F/u in 1 week if symptoms worsen or fail to improve. Pt voiced understanding and agreement to the plan.  [place], DO 05/18/16 11:12  AM

## 2016-05-18 NOTE — Progress Notes (Signed)
Pre visit review using our clinic tool,if applicable. No additional management support is needed unless otherwise documented below in the visit note.  

## 2016-05-29 ENCOUNTER — Ambulatory Visit (INDEPENDENT_AMBULATORY_CARE_PROVIDER_SITE_OTHER): Payer: No Typology Code available for payment source | Admitting: Gynecology

## 2016-05-29 ENCOUNTER — Encounter: Payer: Self-pay | Admitting: Gynecology

## 2016-05-29 VITALS — BP 122/80 | Ht 62.25 in | Wt 159.0 lb

## 2016-05-29 DIAGNOSIS — R102 Pelvic and perineal pain: Secondary | ICD-10-CM

## 2016-05-29 DIAGNOSIS — M6289 Other specified disorders of muscle: Secondary | ICD-10-CM | POA: Diagnosis not present

## 2016-05-29 DIAGNOSIS — Z01419 Encounter for gynecological examination (general) (routine) without abnormal findings: Secondary | ICD-10-CM

## 2016-05-29 LAB — URINALYSIS W MICROSCOPIC + REFLEX CULTURE
Bilirubin Urine: NEGATIVE
CASTS: NONE SEEN [LPF]
CRYSTALS: NONE SEEN [HPF]
Glucose, UA: NEGATIVE
KETONES UR: NEGATIVE
Leukocytes, UA: NEGATIVE
NITRITE: NEGATIVE
PH: 6 (ref 5.0–8.0)
PROTEIN: NEGATIVE
Specific Gravity, Urine: 1.027 (ref 1.001–1.035)
Yeast: NONE SEEN [HPF]

## 2016-05-29 LAB — LIPID PANEL
Cholesterol: 139 mg/dL (ref <200)
HDL: 44 mg/dL — AB (ref >50)
LDL CALC: 80 mg/dL (ref <100)
Total CHOL/HDL Ratio: 3.2 Ratio (ref <5.0)
Triglycerides: 75 mg/dL (ref <150)
VLDL: 15 mg/dL (ref <30)

## 2016-05-29 LAB — COMPREHENSIVE METABOLIC PANEL
ALT: 19 U/L (ref 6–29)
AST: 21 U/L (ref 10–30)
Albumin: 4.2 g/dL (ref 3.6–5.1)
Alkaline Phosphatase: 38 U/L (ref 33–115)
BILIRUBIN TOTAL: 0.3 mg/dL (ref 0.2–1.2)
BUN: 10 mg/dL (ref 7–25)
CALCIUM: 9.1 mg/dL (ref 8.6–10.2)
CO2: 23 mmol/L (ref 20–31)
Chloride: 106 mmol/L (ref 98–110)
Creat: 0.81 mg/dL (ref 0.50–1.10)
GLUCOSE: 78 mg/dL (ref 65–99)
Potassium: 3.8 mmol/L (ref 3.5–5.3)
Sodium: 137 mmol/L (ref 135–146)
Total Protein: 7.4 g/dL (ref 6.1–8.1)

## 2016-05-29 LAB — CBC WITH DIFFERENTIAL/PLATELET
Basophils Absolute: 0 cells/uL (ref 0–200)
Basophils Relative: 0 %
EOS PCT: 1 %
Eosinophils Absolute: 68 cells/uL (ref 15–500)
HCT: 42.8 % (ref 35.0–45.0)
Hemoglobin: 14 g/dL (ref 11.7–15.5)
Lymphocytes Relative: 23 %
Lymphs Abs: 1564 cells/uL (ref 850–3900)
MCH: 29.3 pg (ref 27.0–33.0)
MCHC: 32.7 g/dL (ref 32.0–36.0)
MCV: 89.5 fL (ref 80.0–100.0)
MPV: 9.7 fL (ref 7.5–12.5)
Monocytes Absolute: 612 cells/uL (ref 200–950)
Monocytes Relative: 9 %
NEUTROS PCT: 67 %
Neutro Abs: 4556 cells/uL (ref 1500–7800)
Platelets: 226 10*3/uL (ref 140–400)
RBC: 4.78 MIL/uL (ref 3.80–5.10)
RDW: 13 % (ref 11.0–15.0)
WBC: 6.8 10*3/uL (ref 3.8–10.8)

## 2016-05-29 LAB — TSH: TSH: 2.86 mIU/L

## 2016-05-29 NOTE — Progress Notes (Signed)
Anita Parker 1981/05/01 [id number]   History:    36 y.o.  for annual gyn exam with only complaint is of painful varicose veins bilateral. Patient reports normal menstrual cycle. Husband has had a vasectomy. Patient with no past history of any abnormal Pap smears in the past. Patient was also complaining of tiredness and fatigue and discomfort during intercourse  Past medical history,surgical history, family history and social history were all reviewed and documented in the EPIC chart.  Gynecologic History Patient's last menstrual period was 05/11/2016. Contraception: vasectomy Last Pap: 2012 2016. Results were: normal Last mammogram: Age 74 benign. Results were: normal  Obstetric History OB History  Gravida Para Term Preterm AB Living  4 3 3   1 3   SAB TAB Ectopic Multiple Live Births  1       3    # Outcome Date GA Lbr Len/2nd Weight Sex Delivery Anes PTL Lv  4 SAB           3 Term     M Vag-Spont  N LIV  2 Term     F Vag-Spont  N LIV  1 Term     M Vag-Spont  N LIV       ROS: A ROS was performed and pertinent positives and negatives are included in the history.  GENERAL: No fevers or chills. HEENT: No change in vision, no earache, sore throat or sinus congestion. NECK: No pain or stiffness. CARDIOVASCULAR: No chest pain or pressure. No palpitations. PULMONARY: No shortness of breath, cough or wheeze. GASTROINTESTINAL: No abdominal pain, nausea, vomiting or diarrhea, melena or bright red blood per rectum. GENITOURINARY: No urinary frequency, urgency, hesitancy or dysuria. MUSCULOSKELETAL: No joint or muscle pain, no back pain, no recent trauma. DERMATOLOGIC: No rash, no itching, no lesions. ENDOCRINE: No polyuria, polydipsia, no heat or cold intolerance. No recent change in weight. HEMATOLOGICAL: No anemia or easy bruising or bleeding. NEUROLOGIC: No headache, seizures, numbness, tingling or weakness. PSYCHIATRIC: No depression, no loss of interest in normal activity or change  in sleep pattern.     Exam: chaperone present  BP 122/80   Ht 5' 2.25" (1.581 m)   Wt 159 lb (72.1 kg)   LMP 05/11/2016   BMI 28.85 kg/m   Body mass index is 28.85 kg/m.  General appearance : Well developed well nourished female. No acute distress HEENT: Eyes: no retinal hemorrhage or exudates,  Neck supple, trachea midline, no carotid bruits, no thyroidmegaly Lungs: Clear to auscultation, no rhonchi or wheezes, or rib retractions  Heart: Regular rate and rhythm, no murmurs or gallops Breast:Examined in sitting and supine position were symmetrical in appearance, no palpable masses or tenderness,  no skin retraction, no nipple inversion, no nipple discharge, no skin discoloration, no axillary or supraclavicular lymphadenopathy Abdomen: no palpable masses or tenderness, no rebound or guarding Extremities: no edema or skin discoloration or tenderness  Pelvic:  Bartholin, Urethra, Skene Glands: Within normal limits             Vagina: No gross lesions or discharge  Cervix: No gross lesions or discharge  Uterus  anteverted, normal size, shape and consistency, non-tender and mobile  Adnexa  bilaterally adnexal tenderness during exam incomplete will have ultrasound done next week  Anus and perineum  normal   Rectovaginal  normal sphincter tone without palpated masses or tenderness             Hemoccult not indicated     Assessment/Plan:  35  y.o. female for annual exam with dyspareunia and difficult pelvic exam she will return next week to the office for an ultrasound. Because of her tiredness and fatigue will check a vitamin D level along with a CBC, TSH, comprehensive metabolic panel in addition we'll check a fasting lipid profile urinalysis. Pap smear not indicated this year. Pap smear not indicated this year.   Terrance Mass MD, 10:22 AM 05/29/2016

## 2016-05-29 NOTE — Patient Instructions (Signed)
Venas varicosas  (Varicose Veins)  Las venas varicosas son venas que se han agrandado y tornado sinuosas. Suelen aparecer en las piernas, pero también pueden verse en otra parte del cuerpo.  CAUSAS  Esta afección se presenta como consecuencia del mal funcionamiento de las válvulas de las venas, las cuales ayudan al retorno de la sangre desde las piernas hacia el corazón. Si estas válvulas se dañan, la sangre retrocede y regresa a las venas de la pierna, cerca de la superficie de la piel, lo que causa dilatación venosa.  FACTORES DE RIESGO  Las personas que están mucho tiempo paradas, las embarazadas o las personas con sobrepeso tienen más probabilidades de tener venas varicosas.  SIGNOS Y SÍNTOMAS  · Venas abultadas, azuladas y de aspecto sinuoso que se observan con mayor frecuencia en las piernas.  · Dolor o sensación de pesadez en las piernas. Estos síntomas pueden empeorar al final del día.  · Hinchazón de las piernas.  · Cambios en el color de la piel.    DIAGNÓSTICO  Generalmente, el médico puede diagnosticar las venas varicosas al examinarle las piernas. Además, puede recomendarle que se haga una ecografía de las venas de las piernas.  TRATAMIENTO  La mayoría de las venas varicosas pueden tratarse en casa. Sin embargo, hay otros tratamientos a disposición de las personas que tienen síntomas persistentes o desean mejorar la apariencia estética de las venas varicosas. Estas opciones de tratamiento incluyen lo siguiente:  · Escleroterapia. Se inyecta una solución en la vena para anularla.  · Tratamiento con láser. Se usa un rayo láser para calentar la vena y anularla.  · Ablación venosa por radiofrecuencia Se usa una corriente eléctrica que se produce mediante ondas de radio para anular la vena.  · Flebectomía. Se extirpa quirúrgicamente la vena a través de pequeñas incisiones que se hacen sobre la vena varicosa.  · Ligadura venosa y varicectomía. La vena se extirpa quirúrgicamente a través de incisiones que se  realizan sobre la vena varicosa después de haberla anudado (ligado).  INSTRUCCIONES PARA EL CUIDADO EN EL HOGAR  · No permanezca sentado o de pie en una posición durante mucho tiempo. No se siente con las piernas cruzadas. Descanse con las piernas elevadas durante el día.  · Use medias de compresión como le haya indicado su médico. Estas medias ayudan a evitar la formación de coágulos sanguíneos y a reducir la hinchazón de las piernas.  · No use otras prendas que le ajusten todo el contorno de las piernas, la pelvis o la cintura.  · Camine todo lo posible para aumentar la circulación de la sangre.  · A la noche, eleve el pie de la cama con bloques de 2 pulgadas.  · Si tiene un corte en la piel sobre la vena y la vena sangra, recuéstese con la pierna elevada y ejerza presión en el lugar con un paño limpio, hasta que deje de sangrar. Luego aplique un apósito (vendaje) sobre el corte. Consulte al médico si el sangrado continúa.    SOLICITE ATENCIÓN MÉDICA SI:  · La piel alrededor del tobillo empieza a agrietarse.  · Siente dolor, hay enrojecimiento, sensibilidad o hinchazón dura en la pierna sobre una vena.  · Está incómodo debido al dolor de la pierna.    Esta información no tiene como fin reemplazar el consejo del médico. Asegúrese de hacerle al médico cualquier pregunta que tenga.  Document Released: 02/15/2005 Document Revised: 08/30/2015 Document Reviewed: 11/09/2015  Elsevier Interactive Patient Education © 2017 Elsevier Inc.

## 2016-05-30 LAB — URINE CULTURE: ORGANISM ID, BACTERIA: NO GROWTH

## 2016-05-30 LAB — VITAMIN D 25 HYDROXY (VIT D DEFICIENCY, FRACTURES): Vit D, 25-Hydroxy: 21 ng/mL — ABNORMAL LOW (ref 30–100)

## 2016-06-01 ENCOUNTER — Other Ambulatory Visit: Payer: Self-pay | Admitting: Anesthesiology

## 2016-06-01 DIAGNOSIS — R319 Hematuria, unspecified: Secondary | ICD-10-CM

## 2016-06-01 DIAGNOSIS — E559 Vitamin D deficiency, unspecified: Secondary | ICD-10-CM

## 2016-06-01 MED ORDER — VITAMIN D (ERGOCALCIFEROL) 1.25 MG (50000 UNIT) PO CAPS: 50000.0000 [IU] | ORAL_CAPSULE | ORAL | 0 refills | Status: DC

## 2016-06-03 ENCOUNTER — Telehealth: Payer: Self-pay | Admitting: *Deleted

## 2016-06-03 NOTE — Telephone Encounter (Signed)
GGA - Per Ronda D [employer] [place] with OV $30 copay KW CMA

## 2016-06-08 ENCOUNTER — Other Ambulatory Visit: Payer: No Typology Code available for payment source

## 2016-06-08 ENCOUNTER — Ambulatory Visit: Payer: No Typology Code available for payment source | Admitting: Gynecology

## 2016-06-14 ENCOUNTER — Ambulatory Visit (INDEPENDENT_AMBULATORY_CARE_PROVIDER_SITE_OTHER): Payer: No Typology Code available for payment source

## 2016-06-14 ENCOUNTER — Encounter: Payer: Self-pay | Admitting: Gynecology

## 2016-06-14 ENCOUNTER — Ambulatory Visit (INDEPENDENT_AMBULATORY_CARE_PROVIDER_SITE_OTHER): Payer: No Typology Code available for payment source | Admitting: Gynecology

## 2016-06-14 VITALS — BP 118/80 | Ht 62.0 in | Wt 159.0 lb

## 2016-06-14 DIAGNOSIS — N942 Vaginismus: Secondary | ICD-10-CM

## 2016-06-14 DIAGNOSIS — R3129 Other microscopic hematuria: Secondary | ICD-10-CM | POA: Diagnosis not present

## 2016-06-14 DIAGNOSIS — E559 Vitamin D deficiency, unspecified: Secondary | ICD-10-CM | POA: Diagnosis not present

## 2016-06-14 DIAGNOSIS — R102 Pelvic and perineal pain: Secondary | ICD-10-CM | POA: Diagnosis not present

## 2016-06-14 NOTE — Patient Instructions (Signed)
Deficiencia de vitaminaD (Vitamin D Deficiency) La deficiencia de vitaminaD ocurre cuando el organismo no tiene suficiente vitaminaD. La vitaminaD es importante para el organismo por muchos motivos:  Ayuda al organismo a absorber dos minerales llamados calcio y fsforo.  Desempea un papel en la salud de los huesos.  Puede ayudar a prevenir algunas enfermedades, como la diabetes y la esclerosis mltiple.  Desempea un papel en la funcin muscular, incluida la actividad cardaca. Para obtener vitaminaD, puede hacer lo siguiente:  Consuma alimentos que contengan naturalmente vitaminaD.  Consuma o beba leche u otros productos lcteos con agregado de vitaminaD.  Tome un suplemento de vitaminaD o un suplemento multivitamnico que la contenga.  Expngase al sol. El organismo produce vitaminaD de forma natural cuando se expone la piel a la luz del sol. El organismo transforma la luz del sol en una forma de vitamina que puede utilizar. Si la deficiencia de vitaminaD es grave, puede causar una enfermedad que reblandece los huesos. En los adultos, se la conoce como osteomalacia. En los nios, recibe el nombre de raquitismo. CAUSAS La deficiencia de vitaminaD puede deberse a lo siguiente:  No comer suficientes alimentos que contengan vitamina D.  Exposicin insuficiente al sol.  Sufrir ciertas enfermedades del sistema digestivo que le dificultan al organismo la absorcin de vitaminaD. Estas enfermedades incluyen la enfermedad de Crohn, la pancreatitis crnica y la fibrosis qustica.  Someterse a una ciruga en la que se extirpa una parte del estmago o del intestino delgado.  Ser obeso.  Tener enfermedad renal crnica o enfermedad heptica. FACTORES DE RIESGO Es ms probable que esta afeccin se manifieste en:  Las personas de edad avanzada.  Las personas que no pasan mucho tiempo al aire libre.  Las personas que viven en un centro de atencin de larga estancia.  Las  personas que sufrieron fracturas seas.  Las personas que tienen huesos dbiles o delgados (osteoporosis).  Las personas que sufren una enfermedad o un trastorno que modifica la forma en que el organismo absorbe la vitaminaD.  Las personas de piel oscura.  Las personas que toman algunos medicamentos, como corticoides o determinados anticonvulsivos.  Las personas con sobrepeso u obesidad. SNTOMAS En los casos leves de deficiencia de vitaminaD, puede no haber sntomas. Si el trastorno es grave, los sntomas pueden incluir lo siguiente:  Dolor en los huesos.  Dolor muscular.  Caerse con frecuencia.  Huesos fracturados por una lesin menor. DIAGNSTICO Por lo general, este trastorno se diagnostica mediante un anlisis de sangre. TRATAMIENTO El tratamiento de este trastorno puede depender de la causa. Entre las otras opciones de tratamiento, se incluyen las siguientes:  Tomar suplementos de vitamina D.  Tomar un suplemento de calcio. El mdico le sugerir cul es la dosis ms adecuada para usted. INSTRUCCIONES PARA EL CUIDADO EN EL HOGAR  Tome los medicamentos y los suplementos solamente como se lo haya indicado el mdico.  Consuma alimentos que contengan vitaminaD, como por ejemplo:  Productos lcteos, cereales o jugos fortificados. El trmino fortificado significa que se ha aadido vitaminaD al alimento. Revise la etiqueta del paquete para estar seguro.  Pescados grasos, como el salmn o la trucha.  Huevos.  Ostras.  No utilice camas solares.  Mantenga un peso saludable. Baje de peso, si es necesario.  Concurra a todas las visitas de control como se lo haya indicado el mdico. Esto es importante. SOLICITE ATENCIN MDICA SI:  Los sntomas no desaparecen.  Tiene malestar estomacal (nuseas) o vomita.  Defeca menos que lo habitual o   le resulta difcil defecar (estreimiento). Esta informacin no tiene Marine scientist el consejo del mdico. Asegrese de  hacerle al mdico cualquier pregunta que tenga. Document Released: 07/31/2011 Document Revised: 01/27/2015 Document Reviewed: 09/23/2014 Elsevier Interactive Patient Education  2017 [employer].

## 2016-06-14 NOTE — Progress Notes (Signed)
   Patient is a 36 year old that was seen the office for annual exam January of this year. She's here for an ultrasound due to the fact that she has some discomfort time of pelvic exam and do her slight vaginismus and ultrasound was ordered to better assess her uterus and ovaries. She was asymptomatic today. Her record did indicate that time of her annual exam she had vitamin D deficiency for which she's currently on 50,000 units 1 by mouth every weekly for 12 weeks. Also there was some blood in her urine and she was supposed to give [place] a urine sample today but she is currently menstruating.  Ultrasound: Uterus measures 7.3 x 6.5 x 4.7 cm with endometrial stripe is 7.1 mm. Retroverted uterus normal. Right and left ovary. No adnexal masses.  Patient's husband has had vasectomy  Assessment/plan: Normal pelvic ultrasound. Patient reminded to finish her 3 month treatment for vitamin D deficiency and return to the office for vitamin D level. After that I would like her to begin taking 2000 units of vitamin D 3 cholecalciferol daily. She'll return back to the office 2 weeks after completion of her menstrual cycle to follow-up on her microscopic hematuria for urinalysis.

## 2016-06-26 ENCOUNTER — Other Ambulatory Visit: Payer: No Typology Code available for payment source

## 2016-06-26 DIAGNOSIS — R319 Hematuria, unspecified: Secondary | ICD-10-CM

## 2016-06-26 LAB — URINALYSIS W MICROSCOPIC + REFLEX CULTURE
BILIRUBIN URINE: NEGATIVE
Bacteria, UA: NONE SEEN [HPF]
CASTS: NONE SEEN [LPF]
CRYSTALS: NONE SEEN [HPF]
Glucose, UA: NEGATIVE
Hgb urine dipstick: NEGATIVE
Ketones, ur: NEGATIVE
NITRITE: NEGATIVE
PH: 6.5 (ref 5.0–8.0)
Protein, ur: NEGATIVE
RBC / HPF: NONE SEEN RBC/HPF (ref <=2)
SPECIFIC GRAVITY, URINE: 1.003 (ref 1.001–1.035)
WBC UA: NONE SEEN WBC/HPF (ref <=5)
Yeast: NONE SEEN [HPF]

## 2016-06-27 LAB — URINE CULTURE: ORGANISM ID, BACTERIA: NO GROWTH

## 2016-07-19 ENCOUNTER — Telehealth: Payer: Self-pay | Admitting: *Deleted

## 2016-07-19 NOTE — Telephone Encounter (Signed)
I would recommend she be evaluated by urologist for possible interstital cystitis. Make appointment at [facility] and send her information in Spanish on IC

## 2016-07-19 NOTE — Telephone Encounter (Signed)
Pt called and told claudia that she is still having frequent urination and pelvic discomfort, no pain with urination. Pt asked what to do next? Had normal ultrasound on OV 06/14/16. Please advise

## 2016-07-19 NOTE — Telephone Encounter (Signed)
Referral faxed with notes, they will fax me back with time and date to relay to pt, claudia informed pt with this

## 2016-07-26 NOTE — Telephone Encounter (Signed)
Pt scheduled on 09/11/16 @ 1:15pm with Dr.Herrick pt informed by [facility]

## 2016-09-18 ENCOUNTER — Other Ambulatory Visit: Payer: No Typology Code available for payment source

## 2016-09-18 DIAGNOSIS — E559 Vitamin D deficiency, unspecified: Secondary | ICD-10-CM

## 2016-09-19 ENCOUNTER — Encounter: Payer: No Typology Code available for payment source | Admitting: Internal Medicine

## 2016-09-19 LAB — VITAMIN D 25 HYDROXY (VIT D DEFICIENCY, FRACTURES): Vit D, 25-Hydroxy: 30 ng/mL (ref 30–100)

## 2016-10-04 ENCOUNTER — Encounter: Payer: Self-pay | Admitting: Gynecology

## 2016-10-09 ENCOUNTER — Encounter: Payer: No Typology Code available for payment source | Admitting: Internal Medicine

## 2016-10-12 ENCOUNTER — Ambulatory Visit (INDEPENDENT_AMBULATORY_CARE_PROVIDER_SITE_OTHER): Payer: No Typology Code available for payment source | Admitting: Gynecology

## 2016-10-12 ENCOUNTER — Encounter: Payer: Self-pay | Admitting: Gynecology

## 2016-10-12 VITALS — BP 118/78

## 2016-10-12 DIAGNOSIS — R350 Frequency of micturition: Secondary | ICD-10-CM | POA: Diagnosis not present

## 2016-10-12 DIAGNOSIS — M545 Low back pain, unspecified: Secondary | ICD-10-CM

## 2016-10-12 LAB — URINALYSIS W MICROSCOPIC + REFLEX CULTURE
BILIRUBIN URINE: NEGATIVE
Casts: NONE SEEN [LPF]
Crystals: NONE SEEN [HPF]
GLUCOSE, UA: NEGATIVE
KETONES UR: NEGATIVE
Nitrite: NEGATIVE
PH: 5.5 (ref 5.0–8.0)
Protein, ur: NEGATIVE
Specific Gravity, Urine: 1.025 (ref 1.001–1.035)
Yeast: NONE SEEN [HPF]

## 2016-10-12 MED ORDER — PHENAZOPYRIDINE HCL 97.2 MG PO TABS: 97.0000 mg | ORAL_TABLET | Freq: Three times a day (TID) | ORAL | 0 refills | Status: DC | PRN

## 2016-10-12 MED ORDER — NITROFURANTOIN MONOHYD MACRO 100 MG PO CAPS: 100.0000 mg | ORAL_CAPSULE | Freq: Two times a day (BID) | ORAL | 0 refills | Status: DC

## 2016-10-12 NOTE — Progress Notes (Signed)
   Patient is a 36 year old presented to the office for the past week complaining of urinary frequency and suprapubic discomfort and mild left lower back discomfort. She denies any fever, chills, nausea, vomiting. Her menstrual cycles are normal. Her husband has had a vasectomy. She denies any vaginal discharge. She states that when she urinates she feels like she does not empty completely and has to come back several times during the day.   Exam: Gen. appearance well-developed well-nourished female with the above-mentioned complaining no acute distress Back: No CVA tenderness Abdomen: Suprapubic tenderness abdomen otherwise soft with no rebound or guarding Pelvic: Bartholin urethra Skene was within normal limits Vagina: No lesions or discharge Cervix: No lesions or discharge Uterus anteverted suprapubic tenderness noted but no masses Adnexa: No palpable masses or tenderness Rectal exam: Not done  Urinalysis many bacteria 1+ blood trace leukocyte (0-5 WBC, 3-10 RBC) culture pending  Assessment/plan: Patient with signs and symptoms consistent with urinary tract infection she will be prescribed Macrobid one by mouth twice a day for 7 days and as a anti-spasmodic age and she'll be prescribed Pyridium 200 mg take 1 by mouth 3 times a day for 3 days.

## 2016-10-12 NOTE — Patient Instructions (Signed)
Phenazopyridine tablets Qu es este medicamento? La FENAZOPIRIDINA es un analgsico. Genene Churn para Best boy, ardor o molestia causada por una infeccin o irritacin del tracto urinario. Este medicamento no es un antibitico. No curar una infeccin del tracto urinario. Este medicamento puede ser utilizado para otros usos; si tiene alguna pregunta consulte con su proveedor de atencin mdica o con su farmacutico. MARCAS COMUNES: AZO, Azo-100, Azo-Gesic, Azo-Septic, Azo-Standard, Phenazo, Prodium, Pyridium, Urinary Analgesic, Uristat Qu le debo informar a mi profesional de la salud antes de tomar este medicamento? Necesita saber si usted presenta alguno de los siguientes problemas o situaciones: -deficiencia de glucosa-6-fosfato deshidrogenasa (L-6PD) -enfermedad renal -una reaccin alrgica o inusual a la fenazopiridina, a otros medicamentos, alimentos, colorantes o conservantes -si est embarazada o buscando quedar embarazada -si est amamantando a un beb Cmo debo utilizar este medicamento? Tome este medicamento por va oral con un vaso de agua. Siga las instrucciones de la etiqueta del [place]. Tmelo despus de las comidas. Tome sus dosis a intervalos regulares. No tome su medicamento con una frecuencia mayor que la indicada. No omita ninguna dosis o suspenda el uso de su medicamento antes de lo indicado aun si se siente mejor. No deje de tomarlo excepto si as lo indica su mdico. Hable con su pediatra para informarse acerca del uso de este medicamento en nios. Puede requerir atencin especial. Sobredosis: Pngase en contacto inmediatamente con un centro toxicolgico o una sala de urgencia si usted cree que haya tomado demasiado medicamento. ATENCIN: [employer] es solo para usted. No comparta este medicamento con nadie. Qu sucede si me olvido de una dosis? Si olvida una dosis, tmela lo antes posible. Si es casi la hora de la prxima dosis, tome slo esa dosis. No  tome dosis adicionales o dobles. Qu puede interactuar con este medicamento? No se esperan interacciones. Puede ser que esta lista no menciona todas las posibles interacciones. Informe a su profesional de KB Home	[place] de [employer] productos a base de hierbas, medicamentos de [place] o suplementos nutritivos que est tomando. Si usted fuma, consume bebidas alcohlicas o si utiliza drogas ilegales, indqueselo tambin a su profesional de KB Home	[place]. Algunas sustancias pueden interactuar con su medicamento. A qu debo estar atento al usar Coca-Cola? Si los sntomas no mejoran o si empeoran, consulte con su mdico o con su profesional de KB Home	[place]. Este medicamento produce un color rojo en los lquidos corporales. Este efecto es inofensivo y Armed forces operational officer despus de que deje de tomar este medicamento. Este medicamento cambiar el color de su orina a un color naranja oscura o rojo. El color rojo puede manchar la ropa. Los lentes de contacto se pueden Marketing executive. Es preferible no usar lentes de contacto blandos mientras est tomando este medicamento Si es diabtico podr Therapist, music un resultado positivo falso en los anlisis de determinacin del nivel de Location manager en la orina. Consulte a su proveedor de Geophysical data processor. Qu efectos secundarios puedo tener al [employer] este medicamento? Efectos secundarios que debe informar a su mdico o a Barrister's clerk de la salud tan pronto como sea posible: -Chief of Staff como erupcin cutnea, picazn o urticarias, hinchazn de la cara, labios o lengua -color azul o morado de la piel -dificultad al respirar -fiebre -orinar menos -sangrado, magulladuras inusuales -cansancio o debilidad inusual -vmitos -color amarillento de los ojos o la piel Efectos secundarios que, por lo general, no requieren atencin mdica (debe informarlos a su mdico o a su profesional de la salud si persisten o si  son molestos): -orina de color amarillo oscuro -dolor de  cabeza -Higher education careers adviser Puede ser que esta lista no menciona todos los posibles efectos secundarios. Comunquese a su mdico por asesoramiento mdico [employer]. Usted puede informar los efectos secundarios a la FDA por telfono al 1-800-FDA-1088. Dnde debo guardar mi medicina? Mantngala fuera del alcance de los nios. Gurdela a [employer], entre 15 y 53 grados C (52 y 51 grados F). Protjala de la luz y de la humedad. Deseche todo el medicamento que no haya utilizado, despus de la fecha de vencimiento. ATENCIN: Este folleto es un resumen. Puede ser que no cubra toda la posible informacin. Si usted tiene preguntas acerca de esta medicina, consulte con su mdico, su farmacutico o su profesional de Technical sales engineer.  2018 Elsevier/Gold Standard (2014-06-30 00:00:00) Nitrofurantoin tablets or capsules Qu es este medicamento? La NITROFURANTONA es un antibitico. Se utiliza en el tratamiento de la infecciones del tracto urinario. Este medicamento puede ser utilizado para otros usos; si tiene alguna pregunta consulte con su proveedor de atencin mdica o con su farmacutico. MARCAS COMUNES: Macrobid, Macrodantin, Urotoin Tree surgeon a mi profesional de la salud antes de tomar este medicamento? Necesita saber si usted presenta alguno de los [employer] o situaciones: -anemia -diabetes -deficiencia de glucosa-6-fosfato deshidrogenasa -enfermedad renal -enfermedad heptica -enfermedad pulmonar -otras enfermedades crnicas -una reaccin alrgica o inusual a la nitrofurantona, a otros antibiticos, a otros medicamentos, alimentos, colorantes o conservantes -si est embarazada o buscando quedar embarazada -si est amamantando a un beb Cmo debo utilizar este medicamento? Tome este medicamento por va oral con un vaso de agua. Siga las instrucciones de la etiqueta del [place]. Tome este medicamento con leche o con alimentos. Tome sus dosis a  intervalos regulares. No tome su medicamento con una frecuencia mayor que la indicada. No deje de tomarlo excepto si as lo indica su mdico. Hable con su pediatra para informarse acerca del uso de este medicamento en nios. Aunque este medicamento se puede recetar para condiciones selectivas, las precauciones se aplican. Sobredosis: Pngase en contacto inmediatamente con un centro toxicolgico o una sala de urgencia si usted cree que haya tomado demasiado medicamento. ATENCIN: [employer] es solo para usted. No comparta este medicamento con nadie. Qu sucede si me olvido de una dosis? Si olvida una dosis, tmela lo antes posible. Si es casi la hora de la prxima dosis, tome slo esa dosis. No tome dosis adicionales o dobles. Qu puede interactuar con este medicamento? -anticidos que contienen trisilicato de magnesio -probenecid -antibiticos quinolnicos, tales como ciprofloxacina, lomefloxacino, norfloxacino y ofloxacino -sulfapirazona Puede ser que esta lista no menciona todas las posibles interacciones. Informe a su profesional de KB Home	[place] de [employer] Corporation productos a base de hierbas, medicamentos de [place] o suplementos nutritivos que est tomando. Si usted fuma, consume bebidas alcohlicas o si utiliza drogas ilegales, indqueselo tambin a su profesional de KB Home	[place]. Algunas sustancias pueden interactuar con su medicamento. A qu debo estar atento al usar Coca-Cola? Si los sntomas no mejoran o si experimenta nuevos sntomas, consulte con su mdico o su profesional de KB Home	[place]. Beba varios vasos de Public affairs consultant. Si toma este medicamento durante un perodo de [employer], debe visitar a su mdico para chequear su evolucin peridicamente. Si es diabtico, podr Therapist, music un resultado positivo falso en los [employer] de determinacin del nivel de azcar en la orina con algunas marcas de [place] de [place]. Consulte con su mdico. Qu efectos secundarios puedo [employer]  al Ardell Isaacs  este medicamento? Efectos secundarios que debe informar a su mdico o a Barrister's clerk de la salud tan pronto como sea posible: -Chief of Staff como erupcin cutnea o urticarias, hinchazn de la cara, labios o lengua -dolor en el pecho -tos -dificultad al respirar -mareos, somnolencia -fiebre o infeccin -molestias o dolores articulares -piel plida o teida azul -enrojecimiento, formacin de ampollas, descamacin o aflojamiento de la piel, inclusive dentro de la boca -hormigueo, ardor, Social research officer, government o entumecimiento de las manos o los pies -sangrado o magulladuras inusuales -cansancio o debilidad inusual -color amarillento de ojos o piel Efectos secundarios que, por lo general, no requieren Geophysical data processor (debe informarlos a su mdico o a su profesional de la salud si persisten o si son molestos): -orina de color amarillo oscuro -diarrea -dolor de cabeza -prdida del apetito -nuseas o vmitos -prdida del cabello temporal Puede ser que esta lista no menciona todos los posibles efectos secundarios. Comunquese a su mdico por asesoramiento mdico [employer]. Usted puede informar los efectos secundarios a la FDA por telfono al 1-800-FDA-1088. Dnde debo guardar mi medicina? Mantngala fuera del alcance de los nios. Gurdela a [employer], entre 15 y 18 grados C (64 y 11 grados F). Protjala de la luz. Deseche todo el medicamento que no haya utilizado, despus de la fecha de vencimiento. ATENCIN: Este folleto es un resumen. Puede ser que no cubra toda la posible informacin. Si usted tiene preguntas acerca de esta medicina, consulte con su mdico, su farmacutico o su profesional de Technical sales engineer.  2018 Elsevier/Gold Standard (2014-06-30 00:00:00) Infeccin de las vas [employer] (Urinary Tract Infection, Adult) Una infeccin urinaria (IU) es una infeccin en cualquier parte de las vas urinarias, que [employer] riones, los urteres, la vejiga  y Geologist, engineering. Estos rganos fabrican, Buyer, retail y eliminan la orina del organismo. La IU puede ser una infeccin de la vejiga (cistitis) o infeccin de los riones (pielonefritis). CAUSAS Esta infeccin puede ser causada por hongos, virus o bacterias. Las bacterias son las causas ms comunes de las IU. Esta afeccin tambin puede ser provocada por no vaciar la vejiga por completo durante la miccin en repetidas ocasiones. FACTORES DE RIESGO Es ms probable que esta afeccin se manifieste si:  Usted ignora la necesidad de Garment/textile technologist o retiene la orina durante largos perodos.  No vaca la vejiga completamente durante la miccin.  Se limpia de atrs hacia adelante despus de orinar o defecar, en el caso de que sea [place].  Est circuncidado, en el caso de que sea varn.  Tiene estreimiento.  Tiene colocada una sonda urinaria permanente.  Tiene debilitado el sistema de defensa (inmunitario) del cuerpo.  Tiene una enfermedad que [employer] intestinos, los riones o la vejiga.  Tiene diabetes.  Toma antibiticos con frecuencia o durante largos perodos, y los antibiticos ya no resultan eficaces para combatir algunos tipos de infecciones (resistencia a los antibiticos).  Toma medicamentos que [employer] vas [place].  Est expuesto a sustancias qumicas que le irritan las vas urinarias.  Es mujer. SNTOMAS Los sntomas de esta afeccin incluyen lo siguiente:  Cristy Hilts.  Miccin frecuente o eliminacin de pequeas cantidades de orina con frecuencia.  Necesidad urgente de Garment/textile technologist.  Ardor o dolor al [employer].  Orina con mal olor u olor atpico.  Bennie Hind turbia.  Dolor en la parte baja del abdomen o en la espalda.  Dificultad para orinar.  Sangre en la orina.  Vmitos o ms apetito de lo normal.  Diarrea  o dolor abdominal.  Secrecin vaginal, si es mujer. DIAGNSTICO Esta afeccin se diagnostica mediante sus antecedentes mdicos y un examen fsico. Tambin deber  proporcionar Truddie Coco de orina para realizar anlisis. Podrn indicarle otros estudios, por ejemplo:  Anlisis de [place].  Pruebas de deteccin de enfermedades de transmisin sexual (ETS). Si ha tenido ms de una IU, se pueden hacer estudios de diagnstico por imgenes o una citoscopia para determinar la causa de las infecciones. TRATAMIENTO El tratamiento de esta afeccin suele incluir una combinacin de dos o ms de los siguientes:  Antibiticos.  Otros medicamentos para tratar las causas menos frecuentes de infeccin urinaria.  Medicamentos de venta libre para Best boy.  Consumo de la cantidad necesaria de agua para mantenerse hidratado. [place] los medicamentos de venta libre y los recetados solamente como se lo haya indicado el mdico.  Si le recetaron un antibitico, tmelo como se lo haya indicado el mdico. No deje de tomar los antibiticos aunque comience a Sports administrator.  Evite el alcohol, la cafena, el t y las bebidas gaseosas. Estas sustancias pueden irritar la vejiga.  Beba suficiente lquido para Consulting civil engineer orina clara o de color amarillo plido.  Concurra a todas las visitas de control como se lo haya indicado el mdico. Esto es importante.  Asegrese de lo siguiente:  Vaciar la vejiga con frecuencia y en su totalidad. No contener la orina durante largos perodos.  Vaciar la vejiga antes y despus de Clinical biochemist.  Limpiar de adelante hacia atrs despus de defecar, si es mujer. Usar cada trozo de papel una vez cuando se limpie. SOLICITE ATENCIN MDICA SI:  Siente dolor en la espalda.  Tiene fiebre.  Siente nuseas o vomita.  Los sntomas no mejoran despus de 3das de tratamiento.  Los sntomas desaparecen y Teacher, adult education. SOLICITE ATENCIN MDICA DE INMEDIATO SI:  Siente dolor intenso en la espalda o en la zona inferior del abdomen.  Tiene vmitos y no puede tragar medicamentos  ni agua. Esta informacin no tiene Marine scientist el consejo del mdico. Asegrese de hacerle al mdico cualquier pregunta que tenga. Document Released: 02/15/2005 Document Revised: 08/30/2015 Document Reviewed: 03/29/2015 Elsevier Interactive Patient Education  2017 [employer].

## 2016-10-14 LAB — URINE CULTURE: Organism ID, Bacteria: NO GROWTH

## 2016-10-20 ENCOUNTER — Other Ambulatory Visit: Payer: Self-pay | Admitting: Anesthesiology

## 2016-10-20 DIAGNOSIS — R319 Hematuria, unspecified: Secondary | ICD-10-CM

## 2016-11-21 ENCOUNTER — Other Ambulatory Visit: Payer: No Typology Code available for payment source

## 2016-12-05 ENCOUNTER — Ambulatory Visit (INDEPENDENT_AMBULATORY_CARE_PROVIDER_SITE_OTHER): Payer: No Typology Code available for payment source | Admitting: Internal Medicine

## 2016-12-05 ENCOUNTER — Encounter: Payer: Self-pay | Admitting: Internal Medicine

## 2016-12-05 VITALS — BP 108/62 | HR 63 | Temp 98.3°F | Resp 14 | Ht 62.0 in | Wt 160.2 lb

## 2016-12-05 DIAGNOSIS — Z0000 Encounter for general adult medical examination without abnormal findings: Secondary | ICD-10-CM

## 2016-12-05 DIAGNOSIS — Z114 Encounter for screening for human immunodeficiency virus [HIV]: Secondary | ICD-10-CM

## 2016-12-05 LAB — HIV ANTIBODY (ROUTINE TESTING W REFLEX): HIV: NONREACTIVE

## 2016-12-05 LAB — VITAMIN D 25 HYDROXY (VIT D DEFICIENCY, FRACTURES): VITD: 28.76 ng/mL — ABNORMAL LOW (ref 30.00–100.00)

## 2016-12-05 NOTE — Assessment & Plan Note (Signed)
-  Td 2011 -Female care-- per gyn, Dr Toney Rakes - CCS: Never had a cscope , not indicated  -labs reviewed, all look okay, check a HIV -Diet and exercise discussed

## 2016-12-05 NOTE — Progress Notes (Signed)
Pre visit review using our clinic review tool, if applicable. No additional management support is needed unless otherwise documented below in the visit note. 

## 2016-12-05 NOTE — Progress Notes (Signed)
Subjective:    Patient ID: Anita Parker, female    DOB: [birth year], 36 y.o.   MRN: [id number]  DOS:  12/05/2016 Type of visit - description : cpx Interval history: Has few concerns   Review of Systems Was recently diagnosed with a UTI, treated with antibiotics, now asymptomatic Reports upper back pain, mostly on the right whenever she is active and doing things like doing dishes or  or making bread. Denies upper or lower extremity paresthesias. Occasionally has some throat discomfort with swallowing, denies any acid reflux, weight loss or cough with food.   Other than above, a 14 point review of systems is negative     Past Medical History:  Diagnosis Date  . Birth control    husband's vasectomy  . Condyloma acuminatum of vulva   . MOLE 04/04/2010    Past Surgical History:  Procedure Laterality Date  . NO PAST SURGERIES      Social History   Social History  . Marital status: Married    Spouse name: N/A  . Number of children: 3  . Years of education: N/A   Occupational History  . [employer], cleaning [employer]   Social History Main Topics  . Smoking status: Never Smoker  . Smokeless tobacco: Never Used  . Alcohol use No  . Drug use: No  . Sexual activity: Yes     Comment: patient's partner with vasectomy, INTERCOURSE AGE 79, SEXUAL PARTNERS LESS THAN 5   Other Topics Concern  . Not on file   Social History Narrative   From [place], 4 children, 3 living ones   Household- pt, husband, 3 children     Family History  Problem Relation Age of Onset  . Cancer Maternal Grandmother        UTERINE  . Diabetes Neg Hx   . CAD Neg Hx   . Breast cancer Neg Hx   . Colon cancer Neg Hx     Allergies as of 12/05/2016   No Known Allergies     Medication List       Accurate as of 12/05/16  8:30 PM. Always use your most recent med list.          cholecalciferol 1000 units tablet Commonly known as:  VITAMIN D Take 1,000  Units by mouth daily.          Objective:   Physical Exam BP 108/62 (BP Location: Left Arm, Patient Position: Sitting, Cuff Size: Small)   Pulse 63   Temp 98.3 F (36.8 C) (Oral)   Resp 14   Ht 5\' 2"  (1.575 m)   Wt 160 lb 4 oz (72.7 kg)   LMP 12/05/2016 (Exact Date)   SpO2 98%   BMI 29.31 kg/m   General:   Well developed, well nourished . NAD.  Neck: No  thyromegaly . Full range of motion. No mass or LAD HEENT:  Normocephalic . Face symmetric, atraumatic. Throat symmetric  Lungs:  CTA B Normal respiratory effort, no intercostal retractions, no accessory muscle use. Heart: RRR,  no murmur.  No pretibial edema bilaterally  Abdomen:  Not distended, soft, non-tender. No rebound or rigidity.   Skin: Exposed areas without rash. Not pale. Not jaundice MSK: No TTP at the cervical or thoracic spine. Neurologic:  alert & oriented X3.  Speech normal, gait appropriate for age and unassisted Strength symmetric and appropriate for age. DTRs symmetric. Psych: Cognition and judgment appear intact.  Cooperative with normal attention span  and concentration.  Behavior appropriate. No anxious or depressed appearing.    Assessment & Plan:   Assessment Mole 2011 Condyloma, vulvar Birth control, husband's vasectomy Varicose veins Low vit D   PLAN   Upper back pain: Neurological exam normal, 3 stretching exercises discussed, ok to take occasional Motrin or Tylenol. Call if not improving. Vitamin D deficiency: S/P Ergocalciferol ~05-2016, Currently: OTCs. Labs  RTC one year

## 2016-12-05 NOTE — Patient Instructions (Signed)
GO TO THE LAB : Get the blood work     GO TO THE FRONT DESK Schedule your next appointment for a  physical exam in one year  TOMESE LA Anita Parker AN~O HAGA LOS ESTIRAMIENTOS 2 O 3 VECES POR [name]

## 2016-12-05 NOTE — Assessment & Plan Note (Signed)
Upper back pain: Neurological exam normal, 3 stretching exercises discussed, ok to take occasional Motrin or Tylenol. Call if not improving. Vitamin D deficiency: S/P Ergocalciferol ~05-2016, Currently: OTCs. Labs  RTC one year

## 2016-12-06 MED ORDER — VITAMIN D (ERGOCALCIFEROL) 1.25 MG (50000 UNIT) PO CAPS: 50000.0000 [IU] | ORAL_CAPSULE | ORAL | 0 refills | Status: DC

## 2016-12-06 NOTE — Addendum Note (Signed)
Addended byDamita Dunnings D on: 12/06/2016 04:49 PM   Modules accepted: Orders

## 2016-12-25 ENCOUNTER — Other Ambulatory Visit: Payer: No Typology Code available for payment source

## 2016-12-25 DIAGNOSIS — R319 Hematuria, unspecified: Secondary | ICD-10-CM

## 2016-12-26 LAB — URINALYSIS W MICROSCOPIC + REFLEX CULTURE
BILIRUBIN URINE: NEGATIVE
Casts: NONE SEEN [LPF]
Crystals: NONE SEEN [HPF]
Glucose, UA: NEGATIVE
Hgb urine dipstick: NEGATIVE
KETONES UR: NEGATIVE
NITRITE: NEGATIVE
PH: 5.5 (ref 5.0–8.0)
Protein, ur: NEGATIVE
Specific Gravity, Urine: 1.006 (ref 1.001–1.035)
Yeast: NONE SEEN [HPF]

## 2016-12-27 LAB — URINE CULTURE: Organism ID, Bacteria: NO GROWTH

## 2017-02-28 ENCOUNTER — Telehealth: Payer: Self-pay | Admitting: Internal Medicine

## 2017-02-28 DIAGNOSIS — E559 Vitamin D deficiency, unspecified: Secondary | ICD-10-CM

## 2017-02-28 NOTE — Telephone Encounter (Signed)
Caller name: Takera  Relation to pt: self  Call back number: [phone] Pharmacy:  Reason for call: Pt would like to know if she needs to have lab done to verify her Vitamin D level since she only has 2 wks of  Vitamin D tablets left. If so can provider please put in lab orders so pt can have labs done. Please advise.

## 2017-02-28 NOTE — Telephone Encounter (Signed)
Arrange a vitamin D LAB, one month from today. DX low vitamin D Once she finish ergocalciferol, recommend to take OTC vitamin D, 2000 units daily

## 2017-02-28 NOTE — Telephone Encounter (Signed)
Pt was informed the below and understood, pt scheduled lab appt within a month away.

## 2017-02-28 NOTE — Telephone Encounter (Signed)
Anita Parker- please see Dr. Ethel Rana recommendations below, I have ordered vitamin D labs for 1 month. Thank you.

## 2017-02-28 NOTE — Telephone Encounter (Signed)
Please advise 

## 2017-04-02 ENCOUNTER — Other Ambulatory Visit (INDEPENDENT_AMBULATORY_CARE_PROVIDER_SITE_OTHER): Payer: No Typology Code available for payment source

## 2017-04-02 DIAGNOSIS — E559 Vitamin D deficiency, unspecified: Secondary | ICD-10-CM | POA: Diagnosis not present

## 2017-04-05 LAB — VITAMIN D 1,25 DIHYDROXY
Vitamin D 1, 25 (OH)2 Total: 43 pg/mL (ref 18–72)
Vitamin D2 1, 25 (OH)2: 15 pg/mL
Vitamin D3 1, 25 (OH)2: 28 pg/mL

## 2017-04-30 ENCOUNTER — Ambulatory Visit: Payer: No Typology Code available for payment source | Admitting: Obstetrics & Gynecology

## 2017-06-04 ENCOUNTER — Encounter: Payer: No Typology Code available for payment source | Admitting: Obstetrics & Gynecology

## 2017-06-13 ENCOUNTER — Encounter: Payer: Self-pay | Admitting: Obstetrics & Gynecology

## 2017-06-13 ENCOUNTER — Ambulatory Visit: Payer: No Typology Code available for payment source | Admitting: Obstetrics & Gynecology

## 2017-06-13 VITALS — BP 124/86

## 2017-06-13 DIAGNOSIS — R102 Pelvic and perineal pain: Secondary | ICD-10-CM

## 2017-06-13 DIAGNOSIS — Z113 Encounter for screening for infections with a predominantly sexual mode of transmission: Secondary | ICD-10-CM | POA: Diagnosis not present

## 2017-06-13 DIAGNOSIS — N898 Other specified noninflammatory disorders of vagina: Secondary | ICD-10-CM

## 2017-06-13 LAB — WET PREP FOR TRICH, YEAST, CLUE

## 2017-06-13 NOTE — Patient Instructions (Signed)
1. Vaginal discharge Normal vaginal secretions on exam and negative wet prep.  Gonorrhea and chlamydia pending. - WET PREP FOR [place], YEAST, CLUE - C. trachomatis/N. gonorrhoeae RNA  2. Vaginal odor Wet prep negative.  Patient reassured.  Recommend Probiotic tablets per mouth or vaginally as needed. - WET PREP FOR TRICH, YEAST, CLUE  3. Pelvic pain in female Right intermittent pelvic pain.  No mass felt on gynecologic exam.  Patient will follow up for pelvic ultrasound to evaluate her ovaries.  Possible ovulatory pain.  If pelvic ultrasound negative, will consider progestin only pill. - C. trachomatis/N. gonorrhoeae RNA - US Transvaginal Non-OB; Future  4. Screen for STD (sexually transmitted disease)  - C. trachomatis/N. gonorrhoeae RNA - HIV antibody (with reflex) - RPR - Hepatitis B Surface AntiGEN - Hepatitis C Antibody  Anita Parker, fue un placer conocerle hoy!  Voy a informarle de sus [employer]!  Para ayudar con su secrecion vaginal, puede tomar una tableta de Probiotic por la boca cada semana o una tableta de Probiotic en el vagina cada semana.

## 2017-06-13 NOTE — Progress Notes (Signed)
    Anita Parker [birth year] [id number]        36 y.o.  [id number] Married.  Vasectomy.  RP:  Vaginal discharge with odor and RLQ pain x about 1 week  HPI: Menstrual cycles every 26-30 days, normal flow.  Last menstrual period May 31, 2017.  After her.  Patient felt that her vaginal discharge was slightly yellow and had odor.  Intermittent right lower pelvic pains and tenderness with deep intercourse.  Last intercourse 3 days ago.  Using vasectomy for contraception.  Urine and bowel movements normal.  No fever.  Due for annual gynecologic exam.  Past medical history,surgical history, problem list, medications, allergies, family history and social history were all reviewed and documented in the EPIC chart.  Directed ROS with pertinent positives and negatives documented in the history of present illness/assessment and plan.  Exam:  Vitals:   06/13/17 0838  BP: 124/86   General appearance:  Normal  Abdomen: Soft, nontender, not distended, no mass.  Gynecologic exam: Vulva normal.  Speculum exam: Cervix and vagina normal.  Secretions normal, appear ovulatory.  Wet prep done.  Gonorrhea and Chlamydia cultures done.  Bimanual exam: Uterus anteverted, mobile, normal volume, mildly tender.  No adnexal mass felt.  Mildly tender on the right.  Wet prep negative   Assessment/Plan:  37 y.o. [id number]   1. Vaginal discharge Normal vaginal secretions on exam and negative wet prep.  Gonorrhea and chlamydia pending. - WET PREP FOR [place], YEAST, CLUE - C. trachomatis/N. gonorrhoeae RNA  2. Vaginal odor Wet prep negative.  Patient reassured.  Recommend Probiotic tablets per mouth or vaginally as needed. - WET PREP FOR TRICH, YEAST, CLUE  3. Pelvic pain in female Right intermittent pelvic pain.  No mass felt on gynecologic exam.  Patient will follow up for pelvic ultrasound to evaluate her ovaries.  Possible ovulatory pain.  If pelvic ultrasound negative, will consider progestin only pill. -  C. trachomatis/N. gonorrhoeae RNA - US Transvaginal Non-OB; Future  4. Screen for STD (sexually transmitted disease)  - C. trachomatis/N. gonorrhoeae RNA - HIV antibody (with reflex) - RPR - Hepatitis B Surface AntiGEN - Hepatitis C Antibody  Counseling on above issues more than 50% for 25 minutes.  Princess Bruins MD, 8:56 AM 06/13/2017

## 2017-06-14 LAB — HEPATITIS C ANTIBODY
Hepatitis C Ab: NONREACTIVE
SIGNAL TO CUT-OFF: 0.06 (ref <1.00)

## 2017-06-14 LAB — HEPATITIS B SURFACE ANTIGEN: Hepatitis B Surface Ag: NONREACTIVE

## 2017-06-14 LAB — RPR: RPR Ser Ql: NONREACTIVE

## 2017-06-14 LAB — C. TRACHOMATIS/N. GONORRHOEAE RNA
C. TRACHOMATIS RNA, TMA: NOT DETECTED
N. GONORRHOEAE RNA, TMA: NOT DETECTED

## 2017-06-14 LAB — HIV ANTIBODY (ROUTINE TESTING W REFLEX): HIV: NONREACTIVE

## 2017-08-13 ENCOUNTER — Encounter: Payer: Self-pay | Admitting: Obstetrics & Gynecology

## 2017-08-13 ENCOUNTER — Ambulatory Visit: Payer: No Typology Code available for payment source | Admitting: Obstetrics & Gynecology

## 2017-08-13 VITALS — BP 126/78 | Ht 62.0 in | Wt 163.0 lb

## 2017-08-13 DIAGNOSIS — E663 Overweight: Secondary | ICD-10-CM | POA: Diagnosis not present

## 2017-08-13 DIAGNOSIS — Z9189 Other specified personal risk factors, not elsewhere classified: Secondary | ICD-10-CM | POA: Diagnosis not present

## 2017-08-13 DIAGNOSIS — Z01419 Encounter for gynecological examination (general) (routine) without abnormal findings: Secondary | ICD-10-CM

## 2017-08-13 DIAGNOSIS — Z1151 Encounter for screening for human papillomavirus (HPV): Secondary | ICD-10-CM

## 2017-08-13 NOTE — Progress Notes (Signed)
Anita Parker [birth year] [id number]   History:    37 y.o. [id number] Married.  Vasectomy.  2 sons, 1 daughter.    RP:  Established patient presenting for annual gyn exam   HPI: Menses regular normal every month.  Resolved pelvic pain x 05/2017.  Normal vaginal secretions.  No pain with IC.  Urine/BMs wnl.  Breasts wnl.  BMI 29.81.  Cleans houses, so physically active.  Will improve fitness with outdoor physical activities during the spring and summer.  Past medical history,surgical history, family history and social history were all reviewed and documented in the EPIC chart.  Gynecologic History Patient's last menstrual period was 07/23/2017. Contraception: vasectomy Last Pap: 04/2015. Results were: Negative Last mammogram: Never Bone Density: Never Colonoscopy: Never  Obstetric History OB History  Gravida Para Term Preterm AB Living  4 3 3   1 3   SAB TAB Ectopic Multiple Live Births  1       3    # Outcome Date GA Lbr Len/2nd Weight Sex Delivery Anes PTL Lv  4 SAB           3 Term     M Vag-Spont  N LIV  2 Term     F Vag-Spont  N LIV  1 Term     M Vag-Spont  N LIV     ROS: A ROS was performed and pertinent positives and negatives are included in the history.  GENERAL: No fevers or chills. HEENT: No change in vision, no earache, sore throat or sinus congestion. NECK: No pain or stiffness. CARDIOVASCULAR: No chest pain or pressure. No palpitations. PULMONARY: No shortness of breath, cough or wheeze. GASTROINTESTINAL: No abdominal pain, nausea, vomiting or diarrhea, melena or bright red blood per rectum. GENITOURINARY: No urinary frequency, urgency, hesitancy or dysuria. MUSCULOSKELETAL: No joint or muscle pain, no back pain, no recent trauma. DERMATOLOGIC: No rash, no itching, no lesions. ENDOCRINE: No polyuria, polydipsia, no heat or cold intolerance. No recent change in weight. HEMATOLOGICAL: No anemia or easy bruising or bleeding. NEUROLOGIC: No headache, seizures, numbness,  tingling or weakness. PSYCHIATRIC: No depression, no loss of interest in normal activity or change in sleep pattern.     Exam:   BP 126/78   Ht 5\' 2"  (1.575 m)   Wt 163 lb (73.9 kg)   LMP 07/23/2017 Comment: vasectomy   BMI 29.81 kg/m   Body mass index is 29.81 kg/m.  General appearance : Well developed well nourished female. No acute distress HEENT: Eyes: no retinal hemorrhage or exudates,  Neck supple, trachea midline, no carotid bruits, no thyroidmegaly Lungs: Clear to auscultation, no rhonchi or wheezes, or rib retractions  Heart: Regular rate and rhythm, no murmurs or gallops Breast:Examined in sitting and supine position were symmetrical in appearance, no palpable masses or tenderness,  no skin retraction, no nipple inversion, no nipple discharge, no skin discoloration, no axillary or supraclavicular lymphadenopathy Abdomen: no palpable masses or tenderness, no rebound or guarding Extremities: no edema or skin discoloration or tenderness  Pelvic: Vulva: Normal             Vagina: No gross lesions or discharge  Cervix: No gross lesions or discharge.  Pap/HR HPV done  Uterus  RV, normal size, shape and consistency, non-tender and mobile  Adnexa  Without masses or tenderness  Anus: Normal   Assessment/Plan:  37 y.o. female for annual exam   1. Encounter for routine gynecological examination with Papanicolaou smear of cervix Normal gynecologic exam.  Pap with high-risk HPV done today.  Breast exam normal.  Health labs with family physician.    2. Relies on partner vasectomy for contraception  3. Overweight (BMI 25.0-29.9) Increased physical activity with aerobic activities 5 times a week and weightlifting every 2 days recommended.  Low calorie/carb diet, such as [employer], recommended.  Princess Bruins MD, 10:26 AM 08/13/2017

## 2017-08-14 LAB — PAP, TP IMAGING W/ HPV RNA, RFLX HPV TYPE 16,18/45: HPV DNA High Risk: NOT DETECTED

## 2017-08-15 ENCOUNTER — Encounter: Payer: Self-pay | Admitting: Internal Medicine

## 2017-08-15 ENCOUNTER — Ambulatory Visit: Payer: No Typology Code available for payment source | Admitting: Internal Medicine

## 2017-08-15 VITALS — BP 118/74 | HR 78 | Temp 98.0°F | Resp 14 | Ht 62.0 in | Wt 161.2 lb

## 2017-08-15 DIAGNOSIS — N926 Irregular menstruation, unspecified: Secondary | ICD-10-CM | POA: Diagnosis not present

## 2017-08-15 DIAGNOSIS — J069 Acute upper respiratory infection, unspecified: Secondary | ICD-10-CM | POA: Diagnosis not present

## 2017-08-15 LAB — POCT URINE PREGNANCY: Preg Test, Ur: NEGATIVE

## 2017-08-15 MED ORDER — BENZONATATE 200 MG PO CAPS: 200.0000 mg | ORAL_CAPSULE | Freq: Three times a day (TID) | ORAL | 0 refills | Status: DC | PRN

## 2017-08-15 NOTE — Progress Notes (Signed)
Pre visit review using our clinic review tool, if applicable. No additional management support is needed unless otherwise documented below in the visit note. 

## 2017-08-15 NOTE — Progress Notes (Signed)
Subjective:    Patient ID: Anita Parker, female    DOB: [birth year], 37 y.o.   MRN: [id number]  DOS:  08/15/2017 Type of visit - description : Acute visit Interval history: 2 weeks ago, developed runny nose and left eye congestion and tearing. She got better Started with respiratory symptoms again 4 days ago: Sore throat, persistent cough, + sputum production, dark, greenish.  Interestingly, she reports that had a Pap smear 2 days ago and that day she saw small amount of vag bleed,the next day repports she had frank bleeding " almost like a period".   Review of Systems With the onset of respiratory symptoms had some subjective fever and achiness.  Some nausea.  No vomiting no diarrhea.  Past Medical History:  Diagnosis Date  . Birth control    husband's vasectomy  . Condyloma acuminatum of vulva   . MOLE 04/04/2010    Past Surgical History:  Procedure Laterality Date  . NO PAST SURGERIES      Social History   Socioeconomic History  . Marital status: Married    Spouse name: Not on file  . Number of children: 3  . Years of education: Not on file  . Highest education level: Not on file  Occupational History  . Occupation: [employer], Comptroller: [place]  Social Needs  . Financial resource strain: Not on file  . Food insecurity:    Worry: Not on file    Inability: Not on file  . Transportation needs:    Medical: Not on file    Non-medical: Not on file  Tobacco Use  . Smoking status: Never Smoker  . Smokeless tobacco: Never Used  Substance and Sexual Activity  . Alcohol use: No    Alcohol/week: 0.0 oz  . Drug use: No  . Sexual activity: Yes    Partners: Male    Comment: patient's partner with vasectomy, INTERCOURSE AGE 6, SEXUAL PARTNERS LESS THAN 5  Lifestyle  . Physical activity:    Days per week: Not on file    Minutes per session: Not on file  . Stress: Not on file  Relationships  . Social connections:   Talks on phone: Not on file    Gets together: Not on file    Attends religious service: Not on file    Active member of club or organization: Not on file    Attends meetings of clubs or organizations: Not on file    Relationship status: Not on file  . Intimate partner violence:    Fear of current or ex partner: Not on file    Emotionally abused: Not on file    Physically abused: Not on file    Forced sexual activity: Not on file  Other Topics Concern  . Not on file  Social History Narrative   From [place], 4 children, 3 living ones   Household- pt, husband, 3 children      Allergies as of 08/15/2017   No Known Allergies     Medication List        Accurate as of 08/15/17 11:59 PM. Always use your most recent med list.          benzonatate 200 MG capsule Commonly known as:  TESSALON Take 1 capsule (200 mg total) by mouth 3 (three) times daily as needed for cough.   cholecalciferol 1000 units tablet Commonly known as:  VITAMIN D Take 1,000 Units by mouth daily.  multivitamin tablet Take 1 tablet by mouth daily.          Objective:   Physical Exam BP 118/74 (BP Location: Left Arm, Patient Position: Sitting, Cuff Size: Small)   Pulse 78   Temp 98 F (36.7 C) (Oral)   Resp 14   Ht 5\' 2"  (1.575 m)   Wt 161 lb 4 oz (73.1 kg)   LMP 07/23/2017 Comment: vasectomy   SpO2 98%   BMI 29.49 kg/m   General:   Well developed, well nourished . NAD.  HEENT:  Normocephalic . Face symmetric, atraumatic.  TMs normal.  Throat symmetric, no red. Nose is slightly congested, sinuses no TTP Lungs:  CTA B Normal respiratory effort, no intercostal retractions, no accessory muscle use. Heart: RRR,  no murmur.  No pretibial edema bilaterally  Abdomen: Not distended, mildly tender at the lower abdomen.  No mass or rebound. Skin: Not pale. Not jaundice Neurologic:  alert & oriented X3.  Speech normal, gait appropriate for age and unassisted Psych--  Cognition and judgment  appear intact.  Cooperative with normal attention span and concentration.  Behavior appropriate. No anxious or depressed appearing.      Assessment & Plan:   Assessment Mole 2011 Condyloma, vulvar Birth control, husband's vasectomy Varicose veins Low vit D   PLAN   URI: Supportive treatment Mucinex DM, Tessalon Perles and Flonase.  Call if not better.  Instructions discussed in Spanish Vaginal Bleeding: Started with vaginal bleeding shortly after her Pap smear 2 days ago.  UPT today negative.  Recommend to contact gynecology.  Message to her gyn sent.

## 2017-08-15 NOTE — Patient Instructions (Signed)
FLONASE : 2 SPRAYS A CADA LADO DE LA NARIZ  MUCINEX DM: 1 PASTILLA CADA 12 HORAS  SI TIENE TOS  TAMBIEN PUEDE TOMAR TESSALON PERLES 3 VECES AL DIA SI LA TOS CONTINUA  LLAME SI NO SE SIENTE MEJOR EN 2 O 3 DIAS , PUEDE SER QUE NECESITE ANTIBIOTICOS  LLAME A SU GINECOLOGA

## 2017-08-16 NOTE — Assessment & Plan Note (Signed)
URI: Supportive treatment Mucinex DM, Tessalon Perles and Flonase.  Call if not better.  Instructions discussed in Spanish Vaginal Bleeding: Started with vaginal bleeding shortly after her Pap smear 2 days ago.  UPT today negative.  Recommend to contact gynecology.  Message to her gyn sent.

## 2017-08-17 ENCOUNTER — Encounter: Payer: Self-pay | Admitting: Obstetrics & Gynecology

## 2017-08-17 NOTE — Patient Instructions (Signed)
1. Encounter for routine gynecological examination with Papanicolaou smear of cervix Normal gynecologic exam.  Pap with high-risk HPV done today.  Breast exam normal.  Health labs with family physician.    2. Relies on partner vasectomy for contraception  3. Overweight (BMI 25.0-29.9) Increased physical activity with aerobic activities 5 times a week and weightlifting every 2 days recommended.  Low calorie/carb diet, such as [employer], recommended.  Verdis Frederickson, fue un placer verle de nuevo hoy!  Voy a informarle de sus [employer].  [place] (Health Maintenance, Female) Un estilo de vida saludable y los cuidados preventivos pueden favorecer considerablemente a la salud y Musician. Pregunte a su mdico cul es el cronograma de exmenes peridicos apropiado para usted. Esta es una buena oportunidad para consultarlo sobre cmo prevenir enfermedades y [employer] sano. Adems de los controles, hay muchas otras cosas que puede hacer usted mismo. Los expertos han realizado numerosas investigaciones [employer] cambios en el estilo de vida y las medidas de prevencin que, [place], lo ayudarn a mantenerse sano. Solicite a su mdico ms informacin. EL PESO Y LA DIETA Consuma una dieta saludable.  Asegrese de [employer] verduras, frutas, productos lcteos de bajo contenido de [place] y Advertising account planner.  No consuma muchos alimentos de alto contenido de grasas slidas, azcares agregados o sal.  Realice actividad fsica con regularidad. Esta es una de las prcticas ms importantes que puede hacer por su salud. ? La Delorise Shiner de los adultos deben hacer ejercicio durante al menos 156mnutos por semana. El ejercicio debe aumentar la frecuencia cardaca y pActorla transpiracin (ejercicio de [place]. ? La mayora de los adultos tambin deben hacer ejercicios de elongacin al mToysRusveces a la semana. Agregue esto al su plan de ejercicio  de intensidad moderada. Mantenga un peso saludable.  El ndice de masa corporal ([facility] es una medida que puede utilizarse para identificar posibles problemas de pDonovan Proporciona una estimacin de la grasa corporal basndose en el peso y la altura. Su mdico puede ayudarle a dRadiation protection practitionerIBunker Hilly a lScientist, forensico mTheatre managerun peso saludable.  Para las mujeres de 20aos o ms: ? Un IFaith Regional Health Servicesmenor de 18,5 se considera bajo peso. ? Un IMontana State Hospitalentre 18,5 y 24,9 es normal. ? Un IMemorial Hospitalentre 25 y 29,9 se considera sobrepeso. ? Un IMC de 30 o ms se considera obesidad. Observe los niveles de colesterol y lpidos en la sangre.  Debe comenzar a rEnglish as a second language teacherde lpidos y cResearch officer, trade unionen la sangre a los 20aos y luego repetirlos cada 557aos  Es posible que nAutomotive engineerlos niveles de colesterol con mayor frecuencia si: ? Sus niveles de lpidos y colesterol son altos. ? Es mayor de [id number] ? Presenta un alto riesgo de padecer enfermedades cardacas. DETECCIN DE CNCER Cncer de pulmn  Se recomienda realizar exmenes de deteccin de cncer de pulmn a personas adultas entre 550y 860aos que estn en riesgo de dHorticulturist, commercialde pulmn por sus antecedentes de consumo de tabaco.  Se recomienda una tomografa computarizada de baja dosis de los pulmones todos los aos a las personas que: ? Fuman actualmente. ? Hayan dejado el hbito en algn momento en los ltimos 15aos. ? Hayan fumado durante 30aos un paquete diario. Un paquete-ao equivale a fumar un promedio de un paquete de cigarrillos diario durante un ao.  Los exmenes de deteccin anuales deben continuar hasta que hayan pasado 15aos desde que dej de fumar.  YCarolan Shiver  no debern realizarse si tiene un problema de salud que le impida recibir tratamiento para el cncer de pulmn. Cncer de mama  Practique la autoconciencia de la mama. Esto significa reconocer la apariencia normal de sus mamas y cmo las siente.  Tambin significa realizar  autoexmenes regulares de [employer]. Informe a su mdico sobre cualquier cambio, sin importar cun pequeo sea.  Si tiene entre 20 y 13 aos, un mdico debe realizarle un examen clnico de las mamas como parte del examen regular de [place], cada 1 a 3aos.  Si tiene 40aos o ms, debe Information systems manager clnico de las Microsoft. Tambin considere realizarse una [place] ([place]) todos los [place].  Si tiene antecedentes familiares de cncer de mama, hable con su mdico para someterse a un estudio gentico.  Si tiene alto riesgo de Chief Financial Officer de mama, hable con su mdico para someterse a Public house manager y [employer].  La evaluacin del gen del cncer de mama (BRCA) se recomienda a mujeres que tengan familiares con cnceres relacionados con el BRCA. Los cnceres relacionados con el BRCA incluyen los siguientes: ? [place]. ? Ovario. ? [place]rompas. ? Cnceres de peritoneo.  Los resultados de la evaluacin determinarn la necesidad de asesoramiento gentico y de [place] de BRCA1 y BRCA2. Cncer de cuello del tero El mdico puede recomendarle que se haga pruebas peridicas de deteccin de cncer de los rganos de la pelvis (ovarios, tero y vagina). Estas pruebas incluyen un examen plvico, que abarca controlar si se produjeron cambios microscpicos en la superficie del cuello del tero (prueba de Papanicolaou). Pueden recomendarle que se haga estas pruebas cada 3aos, a partir de los 21aos.  A las mujeres que tienen entre 30 y 47aos, los mdicos pueden recomendarles que se sometan a exmenes plvicos y pruebas de Papanicolaou cada 39aos, o a la prueba de Papanicolaou y el examen plvico en combinacin con estudios de deteccin del virus del papiloma humano (VPH) cada 5aos. Algunos tipos de VPH aumentan el riesgo de Chief Financial Officer de cuello del tero. La prueba para la deteccin del VPH tambin puede realizarse a mujeres de cualquier edad  cuyos resultados de la prueba de Papanicolaou no sean claros.  Es posible que otros mdicos no recomienden exmenes de deteccin a mujeres no embarazadas que se consideran sujetos de bajo riesgo de Chief Financial Officer de pelvis y que no tienen sntomas. Pregntele al mdico si un examen plvico de deteccin es adecuado para usted.  Si ha recibido un tratamiento para Science writer cervical o una enfermedad que podra causar cncer, necesitar realizarse una prueba de Papanicolaou y controles durante al menos 63 aos de concluido el [place]. Si no se ha hecho el Papanicolaou con regularidad, debern volver a evaluarse los factores de riesgo (como tener un nuevo compaero sexual), para Teacher, adult education si debe realizarse los estudios nuevamente. Algunas mujeres sufren problemas mdicos que aumentan la probabilidad de Museum/gallery curator cncer de cuello del tero. En estos casos, el mdico podr [employer] se realicen controles y pruebas de Papanicolaou con ms frecuencia. Cncer colorrectal  Este tipo de cncer puede detectarse y a menudo prevenirse.  Por lo general, los estudios de rutina se deben Medical laboratory scientific officer a Field seismologist a Proofreader de los 66 aos y Berino 26 aos.  Sin embargo, el mdico podr aconsejarle que lo haga antes, si tiene factores de riesgo para el cncer de colon.  Tambin puede recomendarle que use un kit de prueba para Hydrologist  en la materia fecal.  Es posible que se use una pequea cmara en el extremo de un tubo para examinar directamente el colon (sigmoidoscopia o colonoscopia) a fin de Hydrographic surveyor formas tempranas de cncer colorrectal.  Los exmenes de rutina generalmente comienzan a los 33aos.  El examen directo del colon se debe repetir cada 5 a 10aos hasta los 75aos. Sin embargo, es posible que se realicen exmenes con mayor frecuencia, si se detectan formas tempranas de plipos precancerosos o pequeos bultos. Cncer de piel  Revise la piel de la cabeza a los pies con  regularidad.  Informe a su mdico si aparecen nuevos lunares o los que tiene se modifican, especialmente en su forma y color.  Tambin notifique al mdico si tiene un lunar que es ms grande que el tamao de una goma de lpiz.  Siempre use pantalla solar. Aplique pantalla solar de Kerry Dory y repetida a lo largo del Training and development officer.  Protjase usando mangas y [employer], un sombrero de ala ancha y gafas para el sol, siempre que se encuentre en el exterior. ENFERMEDADES CARDACAS, DIABETES E HIPERTENSIN ARTERIAL  La hipertensin arterial causa enfermedades cardacas y [place] el riesgo de ictus. La hipertensin arterial es ms probable en los siguientes casos: ? Las personas que tienen la presin arterial en el extremo del rango normal (100-139/85-89 mm Hg). ? [employer] con sobrepeso u obesidad. ? Scientist, water quality.  Si usted tiene entre 18 y 39 aos, debe medirse la presin arterial cada 3 a 5 aos. Si usted tiene 40 aos o ms, debe medirse la presin arterial [employer]. Debe medirse la presin arterial dos veces: una vez cuando est en un hospital o una clnica y la otra vez cuando est en otro sitio. Registre el promedio de [employer]. Para controlar su presin arterial cuando no est en un hospital o [place], puede usar lo siguiente: ? Jorje Guild automtica para medir la presin arterial en una farmacia. ? Un monitor para medir la presin arterial en el hogar.  Si tiene entre 64 y 75 aos, consulte a su mdico si debe tomar aspirina para prevenir el ictus.  Realcese exmenes de deteccin de la diabetes con regularidad. Esto incluye la toma de [place] de sangre para controlar el nivel de azcar en la sangre durante el [place]. ? Si tiene un peso normal y un bajo riesgo de padecer diabetes, realcese este anlisis cada tres aos despus de los 45aos. ? Si tiene sobrepeso y un alto riesgo de padecer diabetes, considere someterse a este anlisis antes o con  mayor frecuencia. PREVENCIN DE INFECCIONES HepatitisB  Si tiene un riesgo ms alto de Museum/gallery curator hepatitis B, debe someterse a un examen de deteccin de este virus. Se considera que tiene un alto riesgo de contraer hepatitis B si: ? Naci en un pas donde la hepatitis B es frecuente. Pregntele a su mdico qu pases son considerados de Public affairs consultant. ? Sus padres nacieron en un pas de alto riesgo y usted no recibi una vacuna que lo proteja contra la hepatitis B (vacuna contra la hepatitis B). ? [place]. ? [place] agujas para inyectarse drogas. ? Vive con alguien que tiene hepatitis B. ? Ha tenido sexo con alguien que tiene hepatitis B. ? Recibe tratamiento de hemodilisis. ? Toma ciertos medicamentos para el cncer, trasplante de rganos y afecciones autoinmunitarias. Hepatitis C  Se recomienda un anlisis de [place] para: ? [employer] 1945 y 1965. ? Todas  las personas que tengan un riesgo de haber contrado hepatitis C. Enfermedades de transmisin sexual (ETS).  Debe realizarse pruebas de deteccin de enfermedades de transmisin sexual (ETS), incluidas gonorrea y clamidia si: ? Es sexualmente activo y es menor de [id number]. ? Es mayor de 24aos, y Investment banker, operational informa que corre riesgo de tener este tipo de infecciones. ? La actividad sexual ha cambiado desde que le hicieron la ltima prueba de deteccin y tiene un riesgo mayor de Best boy clamidia o Radio broadcast assistant. Pregntele al mdico si usted tiene riesgo.  Si no tiene el VIH, pero corre riesgo de infectarse por el virus, se recomienda tomar diariamente un medicamento recetado para evitar la infeccin. Esto se conoce como profilaxis previa a la exposicin. Se considera que est en riesgo si: ? Es [place] sexualmente y no [place] preservativos habitualmente o no conoce el estado del VIH de sus Advertising copywriter. ? Se inyecta drogas. ? Es [place] sexualmente con Ardelia Mems pareja que tiene VIH. Consulte a su mdico para saber si tiene un  alto riesgo de infectarse por el VIH. Si opta por comenzar la profilaxis previa a la exposicin, primero debe realizarse anlisis de deteccin del VIH. Luego, le harn anlisis cada 19mses mientras est tomando los medicamentos para la profilaxis previa a la exposicin. EFillmore County Hospital Si es premenopusica y puede quedar e[place] solicite a su mdico asesoramiento previo a la concepcin.  Si puede quedar embarazada, tome 400 a [id number](mcg) de cido fAnheuser-Busch  Si desea evitar el embarazo, hable con su mdico sobre el control de la natalidad (anticoncepcin). OSTEOPOROSIS Y MENOPAUSIA  La osteoporosis es una enfermedad en la que los huesos pierden los minerales y la fuerza por el avance de la edad. El resultado pueden ser fracturas graves en los hOld Bethpage El riesgo de osteoporosis puede identificarse con uArdelia Memsprueba de densidad sea.  Si tiene 65aos o ms, o si est en riesgo de sufrir osteoporosis y fracturas, pregunte a su mdico si debe someterse a exmenes.  Consulte a su mdico si debe tomar un suplemento de calcio o de vitamina D para reducir el riesgo de osteoporosis.  La menopausia puede presentar ciertos sntomas fsicos y rGaffer  La terapia de reemplazo hormonal puede reducir algunos de estos sntomas y rGaffer Consulte a su mdico para saber si la terapia de reemplazo hormonal es conveniente para usted. INSTRUCCIONES PARA EL CUIDADO EN EL HOGAR  Realcese los estudios de rutina de la salud, dentales y de lPublic librarian  MJewell  No consuma ningn producto que contenga tabaco, lo que incluye cigarrillos, tabaco de mHigher education careers advisero cPsychologist, sport and exercise  Si est embarazada, no beba alcohol.  Si est amamantando, reduzca el consumo de alcohol y la frecuencia con la que consume.  Si es mujer y no est embarazada limite el consumo de alcohol a no ms de 1 medida por da. Una medida equivale a 12onzas de cerveza, 5onzas de vino o 1onzas  de bebidas alcohlicas de alta graduacin.  No consuma drogas.  No comparta agujas.  Solicite ayuda a su mdico si necesita apoyo o informacin para abandonar las drogas.  Informe a su mdico si a menudo se siente deprimido.  Notifique a su mdico si alguna vez ha sido vctima de abuso o si no se siente seguro en su hogar. Esta informacin no tiene cMarine scientistel consejo del mdico. Asegrese de hacerle al mdico cualquier pregunta que tenga. Document Released: 04/27/2011 Document Revised: 05/29/2014 Document Reviewed: 02/09/2015  Chartered certified accountant Patient Education  [employer].

## 2017-08-23 ENCOUNTER — Telehealth: Payer: Self-pay | Admitting: *Deleted

## 2017-08-23 NOTE — Telephone Encounter (Signed)
Dr.Paz sent Dr.Lavoie a staff message on 08/15/17 stating "Dr Dellis Filbert: I saw our mutual patient today with a URI, she states that she is having vaginal bleeding (not simply spotting) after her Pap smear a couple of days ago. I a asked her to call you but I figure I let you know as well.  She is is mildly tender at the lower abdomen, UPT negative  THX!  JP   Dr.Lavoie then sent me a message to offer a visit to patient, pt is spanish speaking , I send Rosemarie Ax a message to call patient she did twice with no return call, today she called back and said she is no longer having the above issues and declined OV.

## 2018-01-08 ENCOUNTER — Encounter: Payer: No Typology Code available for payment source | Admitting: Internal Medicine

## 2018-01-10 ENCOUNTER — Encounter: Payer: Self-pay | Admitting: Internal Medicine

## 2018-01-10 ENCOUNTER — Ambulatory Visit (INDEPENDENT_AMBULATORY_CARE_PROVIDER_SITE_OTHER): Payer: No Typology Code available for payment source | Admitting: Internal Medicine

## 2018-01-10 VITALS — BP 108/70 | HR 75 | Temp 98.1°F | Resp 16 | Ht 62.0 in | Wt 167.0 lb

## 2018-01-10 DIAGNOSIS — Z0000 Encounter for general adult medical examination without abnormal findings: Secondary | ICD-10-CM

## 2018-01-10 DIAGNOSIS — E559 Vitamin D deficiency, unspecified: Secondary | ICD-10-CM

## 2018-01-10 LAB — LIPID PANEL
CHOL/HDL RATIO: 3
Cholesterol: 136 mg/dL (ref 0–200)
HDL: 50.8 mg/dL (ref >39.00)
LDL CALC: 75 mg/dL (ref 0–99)
NonHDL: 85.22
TRIGLYCERIDES: 52 mg/dL (ref 0.0–149.0)
VLDL: 10.4 mg/dL (ref 0.0–40.0)

## 2018-01-10 LAB — CBC WITH DIFFERENTIAL/PLATELET
BASOS ABS: 0 10*3/uL (ref 0.0–0.1)
Basophils Relative: 0.2 % (ref 0.0–3.0)
EOS ABS: 0 10*3/uL (ref 0.0–0.7)
Eosinophils Relative: 0.7 % (ref 0.0–5.0)
HCT: 39.4 % (ref 36.0–46.0)
Hemoglobin: 13.1 g/dL (ref 12.0–15.0)
LYMPHS ABS: 1.9 10*3/uL (ref 0.7–4.0)
Lymphocytes Relative: 35.8 % (ref 12.0–46.0)
MCHC: 33.2 g/dL (ref 30.0–36.0)
MCV: 88.8 fl (ref 78.0–100.0)
Monocytes Absolute: 0.3 10*3/uL (ref 0.1–1.0)
Monocytes Relative: 5.8 % (ref 3.0–12.0)
NEUTROS ABS: 3.1 10*3/uL (ref 1.4–7.7)
NEUTROS PCT: 57.5 % (ref 43.0–77.0)
PLATELETS: 212 10*3/uL (ref 150.0–400.0)
RBC: 4.44 Mil/uL (ref 3.87–5.11)
RDW: 12.8 % (ref 11.5–15.5)
WBC: 5.4 10*3/uL (ref 4.0–10.5)

## 2018-01-10 LAB — COMPREHENSIVE METABOLIC PANEL
ALT: 18 U/L (ref 0–35)
AST: 19 U/L (ref 0–37)
Albumin: 4.2 g/dL (ref 3.5–5.2)
Alkaline Phosphatase: 39 U/L (ref 39–117)
BUN: 10 mg/dL (ref 6–23)
CALCIUM: 9.3 mg/dL (ref 8.4–10.5)
CHLORIDE: 108 meq/L (ref 96–112)
CO2: 25 meq/L (ref 19–32)
Creatinine, Ser: 0.81 mg/dL (ref 0.40–1.20)
GFR: 84.65 mL/min (ref >60.00)
Glucose, Bld: 92 mg/dL (ref 70–99)
POTASSIUM: 3.7 meq/L (ref 3.5–5.1)
Sodium: 139 mEq/L (ref 135–145)
Total Bilirubin: 0.6 mg/dL (ref 0.2–1.2)
Total Protein: 7.5 g/dL (ref 6.0–8.3)

## 2018-01-10 LAB — VITAMIN D 25 HYDROXY (VIT D DEFICIENCY, FRACTURES): VITD: 27.7 ng/mL — ABNORMAL LOW (ref 30.00–100.00)

## 2018-01-10 NOTE — Progress Notes (Signed)
Subjective:    Patient ID: Anita Parker, female    DOB: [birth year], 37 y.o.   MRN: [id number]  DOS:  01/10/2018 Type of visit - description : cpx Interval history: Here with her daughter, for a CPX   Review of Systems In general feels well,  still has upper back pain, mostly on the right side.  She has a very active job cleaning, she mops and used a broom frequently. Denies any upper or lower extremity paresthesias  Other than above, a 14 point review of systems is negative    Past Medical History:  Diagnosis Date  . Birth control    husband's vasectomy  . Condyloma acuminatum of vulva   . MOLE 04/04/2010    Past Surgical History:  Procedure Laterality Date  . NO PAST SURGERIES      Social History   Socioeconomic History  . Marital status: Married    Spouse name: Not on file  . Number of children: 3  . Years of education: Not on file  . Highest education level: Not on file  Occupational History  . 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 Financial resource strain: Not on file  . Food insecurity:    Worry: Not on file    Inability: Not on file  . Transportation needs:    Medical: Not on file    Non-medical: Not on file  Tobacco Use  . Smoking status: Never Smoker  . Smokeless tobacco: Never Used  Substance and Sexual Activity  . Alcohol use: No    Alcohol/week: 0.0 standard drinks  . Drug use: No  . Sexual activity: Yes    Partners: Male    Comment: patient's partner with vasectomy, INTERCOURSE AGE 21, SEXUAL PARTNERS LESS THAN 5  Lifestyle  . Physical activity:    Days per week: Not on file    Minutes per session: Not on file  . Stress: Not on file  Relationships  . Social connections:    Talks on phone: Not on file    Gets together: Not on file    Attends religious service: Not on file    Active member of club or organization: Not on file    Attends meetings of clubs or organizations:  Not on file    Relationship status: Not on file  . Intimate partner violence:    Fear of current or ex partner: Not on file    Emotionally abused: Not on file    Physically abused: Not on file    Forced sexual activity: Not on file  Other Topics Concern  . Not on file  Social History Narrative   From [place], 4 children, 3 living ones   Household- pt, husband, 3 children     Family History  Problem Relation Age of Onset  . Cancer Maternal Grandmother        UTERINE  . Diabetes Neg Hx   . CAD Neg Hx   . Breast cancer Neg Hx   . Colon cancer Neg Hx      Allergies as of 01/10/2018   No Known Allergies     Medication List        Accurate as of 01/10/18  2:59 PM. Always use your most recent med list.          cholecalciferol 1000 units tablet Commonly known as:  VITAMIN D Take 1,000 Units by mouth daily.   multivitamin tablet  Take 1 tablet by mouth daily.          Objective:   Physical Exam BP 108/70 (BP Location: Left Arm, Patient Position: Sitting, Cuff Size: Normal)   Pulse 75   Temp 98.1 F (36.7 C) (Oral)   Resp 16   Ht 5\' 2"  (1.575 m)   Wt 167 lb (75.8 kg)   SpO2 99%   BMI 30.54 kg/m  General: Well developed, NAD, see BMI.  Neck: No  thyromegaly  HEENT:  Normocephalic . Face symmetric, atraumatic Lungs:  CTA B Normal respiratory effort, no intercostal retractions, no accessory muscle use. Heart: RRR,  no murmur.  No pretibial edema bilaterally  Abdomen:  Not distended, soft, non-tender. No rebound or rigidity.   Skin: Has small areas of flat hyperpigmentation at the face. Neurologic:  alert & oriented X3.  Speech normal, gait appropriate for age and unassisted Strength symmetric and appropriate for age.  Psych: Cognition and judgment appear intact.  Cooperative with normal attention span and concentration.  Behavior appropriate. No anxious or depressed appearing.     Assessment & Plan:   Assessment Mole 2011 Condyloma,  vulvar Birth control, husband's vasectomy Varicose veins Low vit D   PLAN   Upper back pain: Ongoing issue, likely job-related, her job is physical.  Currently is doing occasionally stretching and ibuprofen, still symptomatic.  Recommend PT, she is reluctant to proceed, we agreed that she will do stretching consistently, take OTCs as needed and call for a PT referral when ready Melasma: See physical exam, DX is likely melasma, recommend sun blocker, if not better will need to see dermatology. Varicose veins: s/p treatment elsewhere RTC 1 year

## 2018-01-10 NOTE — Assessment & Plan Note (Addendum)
-  Td 2011 -Female care: saw  Gyn 07/2017 - CCS: Never had a cscope , not indicated  - Labs: CMP, FLP, CBC -Diet and exercise discussed

## 2018-01-10 NOTE — Patient Instructions (Signed)
  GO TO THE LAB : Get the blood work     GO TO THE FRONT DESK Schedule your next appointment for a physical exam n 1 year   PARA EL DOLOR DE ESPALDA: Continue los estiramientos [place] ibuprofeno o Tylenol los dias que le molesta mucho llame si necesita PT (therapia fisica)  Use bloqueador solar en la cara ("sunblocker")

## 2018-01-10 NOTE — Assessment & Plan Note (Signed)
Upper back pain: Ongoing issue, likely job-related, her job is physical.  Currently is doing occasionally stretching and ibuprofen, still symptomatic.  Recommend PT, she is reluctant to proceed, we agreed that she will do stretching consistently, take OTCs as needed and call for a PT referral when ready Melasma: See physical exam, DX is likely melasma, recommend sun blocker, if not better will need to see dermatology. Varicose veins: s/p treatment elsewhere RTC 1 year

## 2018-01-14 ENCOUNTER — Other Ambulatory Visit: Payer: Self-pay

## 2018-01-14 MED ORDER — ERGOCALCIFEROL 1.25 MG (50000 UT) PO CAPS: 50000.0000 [IU] | ORAL_CAPSULE | ORAL | 2 refills | Status: DC

## 2018-06-26 ENCOUNTER — Ambulatory Visit: Payer: No Typology Code available for payment source | Admitting: Internal Medicine

## 2018-06-26 ENCOUNTER — Encounter: Payer: Self-pay | Admitting: Internal Medicine

## 2018-06-26 VITALS — BP 104/76 | HR 70 | Temp 98.0°F | Resp 16 | Ht 64.0 in | Wt 169.0 lb

## 2018-06-26 DIAGNOSIS — R202 Paresthesia of skin: Secondary | ICD-10-CM | POA: Diagnosis not present

## 2018-06-26 DIAGNOSIS — E559 Vitamin D deficiency, unspecified: Secondary | ICD-10-CM

## 2018-06-26 MED ORDER — GABAPENTIN 300 MG PO CAPS: 300.0000 mg | ORAL_CAPSULE | Freq: Every day | ORAL | 2 refills | Status: DC

## 2018-06-26 NOTE — Progress Notes (Signed)
Subjective:    Patient ID: Anita Parker, female    DOB: 05-11-1981, 38 y.o.   MRN: [id number]  DOS:  06/26/2018 Type of visit - description: Acute visit, here with her husband Symptoms started 4 weeks ago they are increasing for the last 2 weeks. Reports pain at the lower extremities symmetrically, between the knees and  ankles. She has a hard time describing her symptoms, sometimes it is simply pain, sometimes the area feels cold, sometimes is numbness. I asked when is the worse and she feels the pain both at work and at night. RLS?  She is not sure if shaking her leg would help.  Review of Systems Denies low back pain Admits to occasional neck pain without radiation to the sides. Denies bladder or bowel incontinence No gait disturbance No lower extremity edema. Again, no actual knee or ankle pain.  Past Medical History:  Diagnosis Date  . Birth control    husband's vasectomy  . Condyloma acuminatum of vulva   . MOLE 04/04/2010    Past Surgical History:  Procedure Laterality Date  . NO PAST SURGERIES      Social History   Socioeconomic History  . Marital status: Married    Spouse name: Not on file  . Number of children: 3  . Years of education: Not on file  . Highest education level: Not on file  Occupational History  . 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 Financial resource strain: Not on file  . Food insecurity:    Worry: Not on file    Inability: Not on file  . Transportation needs:    Medical: Not on file    Non-medical: Not on file  Tobacco Use  . Smoking status: Never Smoker  . Smokeless tobacco: Never Used  Substance and Sexual Activity  . Alcohol use: No    Alcohol/week: 0.0 standard drinks  . Drug use: No  . Sexual activity: Yes    Partners: Male    Comment: patient's partner with vasectomy, INTERCOURSE AGE 43, SEXUAL PARTNERS LESS THAN 5  Lifestyle  . Physical activity:    Days  per week: Not on file    Minutes per session: Not on file  . Stress: Not on file  Relationships  . Social connections:    Talks on phone: Not on file    Gets together: Not on file    Attends religious service: Not on file    Active member of club or organization: Not on file    Attends meetings of clubs or organizations: Not on file    Relationship status: Not on file  . Intimate partner violence:    Fear of current or ex partner: Not on file    Emotionally abused: Not on file    Physically abused: Not on file    Forced sexual activity: Not on file  Other Topics Concern  . Not on file  Social History Narrative   From [place], 4 children, 3 living ones   Household- pt, husband, 3 children      Allergies as of 06/26/2018   No Known Allergies     Medication List       Accurate as of June 26, 2018  3:46 PM. Always use your most recent med list.        cholecalciferol 1000 units tablet Commonly known as:  VITAMIN D Take 1,000 Units by mouth daily.   ergocalciferol  1.25 MG (50000 UT) capsule Commonly known as:  VITAMIN D2 Take 1 capsule (50,000 Units total) by mouth once a week.   multivitamin tablet Take 1 tablet by mouth daily.           Objective:   Physical Exam BP 104/76 (BP Location: Right Arm, Patient Position: Sitting, Cuff Size: Small)   Pulse 70   Temp 98 F (36.7 C) (Oral)   Resp 16   Ht 5\' 4"  (1.626 m)   Wt 169 lb (76.7 kg)   LMP 06/17/2018   SpO2 98%   BMI 29.01 kg/m   General:   Well developed, NAD, BMI noted. HEENT:  Normocephalic . Face symmetric, atraumatic Cardiovascular: Normal pedal pulses, all toes are well perfused.  Good capillary refill. Skin: Skin of the lower extremities is normal with no lesions or varicose veins MSK: Neck: Full range of motion, no TTP at the cervical spine. No TTP of the lumbar spine or SI joints Neurologic:  alert & oriented X3.  Speech normal, gait appropriate for age and unassisted. Normal  motor and DTR Pinprick examination: Slightly decreased feeling in a patchy fashion distal from the knee? Psych--  Cognition and judgment appear intact.  Cooperative with normal attention span and concentration.  Behavior appropriate. No anxious or depressed appearing.       Assessment     Assessment Mole 2011 Condyloma, vulvar Birth control, husband's vasectomy Varicose veins Low vit D   PLAN   Lower extremity pain/paresthesias: As described above, it is difficult to tease out if this is a paresthesia,  MSK pain or something else (RLS?  Varicose vein pain?) Arterial vascular exam is normal. Plan: Check a B12, TSH, AiC, iron, ferritin and vitamin D (history of vitamin D deficiency, status post replacement). Trial will gabapentin at night, reassess in 2 months.  Neurology referral? RTC 2 months

## 2018-06-26 NOTE — Patient Instructions (Signed)
GO TO THE LAB : Get the blood work     GO TO THE FRONT DESK Schedule your next appointment   checkup in 2 months  Start taking gabapentin at night  Will cause you to be sleepy

## 2018-06-27 LAB — FERRITIN: Ferritin: 15.5 ng/mL (ref 10.0–291.0)

## 2018-06-27 LAB — HEMOGLOBIN A1C: Hgb A1c MFr Bld: 5.3 % (ref 4.6–6.5)

## 2018-06-27 LAB — B12 AND FOLATE PANEL
Folate: 19.2 ng/mL (ref >5.9)
Vitamin B-12: 600 pg/mL (ref 211–911)

## 2018-06-27 LAB — TSH: TSH: 1.86 u[IU]/mL (ref 0.35–4.50)

## 2018-06-27 LAB — IRON: Iron: 64 ug/dL (ref 42–145)

## 2018-06-27 NOTE — Assessment & Plan Note (Signed)
Lower extremity pain/paresthesias: As described above, it is difficult to tease out if this is a paresthesia,  MSK pain or something else (RLS?  Varicose vein pain?) Arterial vascular exam is normal. Plan: Check a B12, TSH, AiC, iron, ferritin and vitamin D (history of vitamin D deficiency, status post replacement). Trial will gabapentin at night, reassess in 2 months.  Neurology referral? RTC 2 months

## 2018-06-30 LAB — VITAMIN D 1,25 DIHYDROXY
VITAMIN D 1, 25 (OH) TOTAL: 52 pg/mL (ref 18–72)
Vitamin D2 1, 25 (OH)2: 14 pg/mL
Vitamin D3 1, 25 (OH)2: 38 pg/mL

## 2018-07-02 ENCOUNTER — Ambulatory Visit: Payer: No Typology Code available for payment source | Admitting: Internal Medicine

## 2018-08-26 ENCOUNTER — Ambulatory Visit: Payer: No Typology Code available for payment source | Admitting: Internal Medicine

## 2018-09-25 ENCOUNTER — Ambulatory Visit (INDEPENDENT_AMBULATORY_CARE_PROVIDER_SITE_OTHER): Payer: No Typology Code available for payment source | Admitting: Internal Medicine

## 2018-09-25 ENCOUNTER — Encounter: Payer: Self-pay | Admitting: Internal Medicine

## 2018-09-25 ENCOUNTER — Telehealth: Payer: Self-pay | Admitting: Internal Medicine

## 2018-09-25 ENCOUNTER — Other Ambulatory Visit: Payer: Self-pay

## 2018-09-25 DIAGNOSIS — Z03818 Encounter for observation for suspected exposure to other biological agents ruled out: Secondary | ICD-10-CM | POA: Diagnosis not present

## 2018-09-25 DIAGNOSIS — B349 Viral infection, unspecified: Secondary | ICD-10-CM | POA: Diagnosis not present

## 2018-09-25 NOTE — Progress Notes (Signed)
Subjective:    Patient ID: Anita Parker, female    DOB: January 20, 1981, 38 y.o.   MRN: [id number]  DOS:  09/25/2018 Type of visit - description: Virtual Visit via Video Note  I connected with@ on 09/27/18 at  2:00 PM EDT by a video enabled telemedicine application and verified that I am speaking with the correct person using two identifiers.   THIS ENCOUNTER IS A VIRTUAL VISIT DUE TO COVID-19 - PATIENT WAS NOT SEEN IN THE OFFICE. PATIENT HAS CONSENTED TO VIRTUAL VISIT / TELEMEDICINE VISIT   Location of patient: home  Location of provider: office  I discussed the limitations of evaluation and management by telemedicine and the availability of in person appointments. The patient expressed understanding and agreed to proceed.  History of Present Illness: Acute visit Symptoms started 09/23/2018: Fever on and off associated with chills and feeling really cold.  Has checked her temperature only once and it was 100.0 last night Headaches, worse at nights, worse when she coughs. Has generalized myalgias. Taking Advil as needed. Denies any contact with known covid patients. Decided not to go to work since 09/23/2018    Review of Systems Admits to mild sore throat and runny nose No chest pain or difficulty breathing No nausea, vomiting, diarrhea. No rash or any unusual skin lesions. No dizziness.   Past Medical History:  Diagnosis Date  . Birth control    husband's vasectomy  . Condyloma acuminatum of vulva   . MOLE 04/04/2010    Past Surgical History:  Procedure Laterality Date  . NO PAST SURGERIES      Social History   Socioeconomic History  . Marital status: Married    Spouse name: Not on file  . Number of children: 3  . Years of education: Not on file  . Highest education level: Not on file  Occupational History  . 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 Financial resource strain: Not on file  . Food  insecurity:    Worry: Not on file    Inability: Not on file  . Transportation needs:    Medical: Not on file    Non-medical: Not on file  Tobacco Use  . Smoking status: Never Smoker  . Smokeless tobacco: Never Used  Substance and Sexual Activity  . Alcohol use: No    Alcohol/week: 0.0 standard drinks  . Drug use: No  . Sexual activity: Yes    Partners: Male    Comment: patient's partner with vasectomy, INTERCOURSE AGE 44, SEXUAL PARTNERS LESS THAN 5  Lifestyle  . Physical activity:    Days per week: Not on file    Minutes per session: Not on file  . Stress: Not on file  Relationships  . Social connections:    Talks on phone: Not on file    Gets together: Not on file    Attends religious service: Not on file    Active member of club or organization: Not on file    Attends meetings of clubs or organizations: Not on file    Relationship status: Not on file  . Intimate partner violence:    Fear of current or ex partner: Not on file    Emotionally abused: Not on file    Physically abused: Not on file    Forced sexual activity: Not on file  Other Topics Concern  . Not on file  Social History Narrative   From [place], 4  children, 3 living ones   Household- pt, husband, 2  children   Boy 46 years old, girl 4 years old            Objective:   Physical Exam There were no vitals taken for this visit. This is a virtual video visit, alert oriented x3.  Nose is slightly congested but otherwise not toxic appearing    Assessment     Assessment Mole 2011 Condyloma, vulvar Birth control, husband's vasectomy Varicose veins Low vit D   PLAN   Viral syndrome: Symptoms very suspicious for COVID-19, denies any specific contacts Advised patient I suspect COVID-19, I explained her what it is, plan: Stay at home for 10 days and until she is fever free for 3 days without medication Check her temperature twice a day Push fluids, Tylenol 500 mg 2 tablets 3 times a day,  Robitussin as needed. ER if severe symptoms, rash, increased headache, increased sore throat, chest pain or difficulty breathing for further evaluation and treatment. She is extremely contagious, she needs to wear a mask and distance from her household (2 children and her husband). Strongly encouraged to check the CDC website in [place] The patient asked me to call her supervisor at [phone], she gave me a verbal consent to call him, my nurse did and  left a message, will fax a excuse for 10 days when needed.  Unable to sign  ROI since she cannot come to the office.  Needs to be off for 10 days Next visit in 4 days, virtual to reassess.   Other issues: Not taking gabapentin, see last visit    Today, I spent more than 25   min with the patient: >50% of the time counseling regards COVID-19: what it is, what to expect, how to isolate herself, also coordinating her care   I discussed the assessment and treatment plan with the patient. The patient was provided an opportunity to ask questions and all were answered. The patient agreed with the plan and demonstrated an understanding of the instructions.   The patient was advised to call back or seek an in-person evaluation if the symptoms worsen or if the condition fails to improve as anticipated.

## 2018-09-25 NOTE — Telephone Encounter (Signed)
Schedule pt with provider today.

## 2018-09-26 NOTE — Telephone Encounter (Signed)
Patient called back with fax number: [phone] per [facility]

## 2018-09-27 NOTE — Telephone Encounter (Signed)
Work note faxed

## 2018-09-27 NOTE — Assessment & Plan Note (Signed)
Viral syndrome: Symptoms very suspicious for COVID-19, denies any specific contacts Advised patient I suspect COVID-19, I explained her what it is, plan: Stay at home for 10 days and until she is fever free for 3 days without medication Check her temperature twice a day Push fluids, Tylenol 500 mg 2 tablets 3 times a day, Robitussin as needed. ER if severe symptoms, rash, increased headache, increased sore throat, chest pain or difficulty breathing for further evaluation and treatment. She is extremely contagious, she needs to wear a mask and distance from her household (2 children and her husband). Strongly encouraged to check the CDC website in [place] The patient asked me to call her supervisor at [phone], she gave me a verbal consent to call him, my nurse did and  left a message, will fax a excuse for 10 days when needed.  Unable to sign  ROI since she cannot come to the office.  Needs to be off for 10 days Next visit in 4 days, virtual to reassess.   Other issues: Not taking gabapentin, see last visit

## 2018-09-30 ENCOUNTER — Ambulatory Visit (INDEPENDENT_AMBULATORY_CARE_PROVIDER_SITE_OTHER): Payer: No Typology Code available for payment source | Admitting: Internal Medicine

## 2018-09-30 ENCOUNTER — Other Ambulatory Visit: Payer: Self-pay

## 2018-09-30 DIAGNOSIS — Z03818 Encounter for observation for suspected exposure to other biological agents ruled out: Secondary | ICD-10-CM | POA: Diagnosis not present

## 2018-09-30 DIAGNOSIS — B349 Viral infection, unspecified: Secondary | ICD-10-CM | POA: Diagnosis not present

## 2018-09-30 NOTE — Progress Notes (Signed)
Subjective:    Patient ID: Anita Parker, female    DOB: 12-Nov-1980, 38 y.o.   MRN: [id number]  DOS:  09/30/2018 Type of visit - description: Virtual Visit via Video Note  I connected with@ on 09/30/18 at 10:00 AM EDT by a video enabled telemedicine application and verified that I am speaking with the correct person using two identifiers.   THIS ENCOUNTER IS A VIRTUAL VISIT DUE TO COVID-19 - PATIENT WAS NOT SEEN IN THE OFFICE. PATIENT HAS CONSENTED TO VIRTUAL VISIT / TELEMEDICINE VISIT   Location of patient: home  Location of provider: office  I discussed the limitations of evaluation and management by telemedicine and the availability of in person appointments. The patient expressed understanding and agreed to proceed.  History of Present Illness: Follow-up The patient was diagnosed with viral syndrome last week. Labs fever was 09/28/2018. She continue with cough, worse at night. Continue with myalgias, worse at night. She has been able to take Robitussin and drinking plenty fluids.    Review of Systems + Mild nasal discharge. No chest pain no difficulty breathing No further GI symptoms: Specifically no nausea, vomiting, diarrhea Still has occasional headaches, mostly at night.  Past Medical History:  Diagnosis Date  . Birth control    husband's vasectomy  . Condyloma acuminatum of vulva   . MOLE 04/04/2010    Past Surgical History:  Procedure Laterality Date  . NO PAST SURGERIES      Social History   Socioeconomic History  . Marital status: Married    Spouse name: Not on file  . Number of children: 3  . Years of education: Not on file  . Highest education level: Not on file  Occupational History  . 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 Financial resource strain: Not on file  . Food insecurity:    Worry: Not on file    Inability: Not on file  . Transportation needs:    Medical: Not on file   Non-medical: Not on file  Tobacco Use  . Smoking status: Never Smoker  . Smokeless tobacco: Never Used  Substance and Sexual Activity  . Alcohol use: No    Alcohol/week: 0.0 standard drinks  . Drug use: No  . Sexual activity: Yes    Partners: Male    Comment: patient's partner with vasectomy, INTERCOURSE AGE 58, SEXUAL PARTNERS LESS THAN 5  Lifestyle  . Physical activity:    Days per week: Not on file    Minutes per session: Not on file  . Stress: Not on file  Relationships  . Social connections:    Talks on phone: Not on file    Gets together: Not on file    Attends religious service: Not on file    Active member of club or organization: Not on file    Attends meetings of clubs or organizations: Not on file    Relationship status: Not on file  . Intimate partner violence:    Fear of current or ex partner: Not on file    Emotionally abused: Not on file    Physically abused: Not on file    Forced sexual activity: Not on file  Other Topics Concern  . Not on file  Social History Narrative   From [place], 4 children, 3 living ones   Household- pt, husband, 2  children   Boy 37 years old, girl 2 years old  Allergies as of 09/30/2018   No Known Allergies     Medication List       Accurate as of Sep 30, 2018  3:47 PM. If you have any questions, ask your nurse or doctor.        cholecalciferol 1000 units tablet Commonly known as:  VITAMIN D Take 1,000 Units by mouth daily.   multivitamin tablet Take 1 tablet by mouth daily.           Objective:   Physical Exam There were no vitals taken for this visit. This is a virtual video visit, the patient seems alert oriented x3, no distress, using a mask.    Assessment    Assessment Mole 2011 Condyloma, vulvar Birth control, husband's vasectomy Varicose veins Low vit D   PLAN   Viral syndrome: See last visit, suspicious for COVID-19. Last fever was 2 days ago, unclear to me if she is still taking  Tylenol. Plan: Continue with rest, increase fluid intake and quarantine. Continue Robitussin-DM, declined other cough medicines, states she prefers home remedies. I again recommend she does not go back to work until: She feels well and she has 3 days without fever and without taking ibuprofen or acetaminophen.  She verbalized understanding. I anticipate/hope she will be able to go back to work 10/07/2018.  If she needs a letter for work, she will reach out.   I discussed the assessment and treatment plan with the patient. The patient was provided an opportunity to ask questions and all were answered. The patient agreed with the plan and demonstrated an understanding of the instructions.   The patient was advised to call back or seek an in-person evaluation if the symptoms worsen or if the condition fails to improve as anticipated.

## 2018-09-30 NOTE — Assessment & Plan Note (Signed)
Viral syndrome: See last visit, suspicious for COVID-19. Last fever was 2 days ago, unclear to me if she is still taking Tylenol. Plan: Continue with rest, increase fluid intake and quarantine. Continue Robitussin-DM, declined other cough medicines, states she prefers home remedies. I again recommend she does not go back to work until: She feels well and she has 3 days without fever and without taking ibuprofen or acetaminophen.  She verbalized understanding. I anticipate/hope she will be able to go back to work 10/07/2018.  If she needs a letter for work, she will reach out.

## 2018-10-01 NOTE — Telephone Encounter (Signed)
Pt called the office stating that she also would like a copy of the excuse work note so she can take it to work for any other reasons. Pt would like to have letter send to her mailing address: [address] [place], [place] [address]. Please advise ASAP.

## 2018-10-01 NOTE — Telephone Encounter (Signed)
Anita Parker- please inform Pt that letter has been placed in mail today. Thank you.

## 2018-12-12 ENCOUNTER — Other Ambulatory Visit: Payer: Self-pay

## 2018-12-13 ENCOUNTER — Ambulatory Visit: Payer: No Typology Code available for payment source | Admitting: Obstetrics & Gynecology

## 2018-12-13 ENCOUNTER — Encounter: Payer: Self-pay | Admitting: Obstetrics & Gynecology

## 2018-12-13 VITALS — BP 120/78 | Ht 62.0 in | Wt 171.0 lb

## 2018-12-13 DIAGNOSIS — Z9189 Other specified personal risk factors, not elsewhere classified: Secondary | ICD-10-CM

## 2018-12-13 DIAGNOSIS — N898 Other specified noninflammatory disorders of vagina: Secondary | ICD-10-CM | POA: Diagnosis not present

## 2018-12-13 DIAGNOSIS — Z6831 Body mass index (BMI) 31.0-31.9, adult: Secondary | ICD-10-CM

## 2018-12-13 DIAGNOSIS — E6609 Other obesity due to excess calories: Secondary | ICD-10-CM

## 2018-12-13 DIAGNOSIS — Z01419 Encounter for gynecological examination (general) (routine) without abnormal findings: Secondary | ICD-10-CM

## 2018-12-13 LAB — WET PREP FOR TRICH, YEAST, CLUE

## 2018-12-13 NOTE — Addendum Note (Signed)
Addended by: Thurnell Garbe A on: 12/13/2018 03:47 PM   Modules accepted: Orders

## 2018-12-13 NOTE — Patient Instructions (Signed)
1. Encounter for routine gynecological examination with Papanicolaou smear of cervix Normal gynecologic exam.  Pap reflex done.  Breast exam normal.  Health labs with family physician.  2. Relies on partner vasectomy for contraception  3. Vaginal odor Wet prep normal, patient reassured.  Recommend probiotic tablet vaginally once a week for prevention. - WET PREP FOR TRICH, YEAST, CLUE  4. Class 1 obesity due to excess calories without serious comorbidity with body mass index (BMI) of 31.0 to 31.9 in adult Recommend lower calorie/carb diet such as [employer].  Aerobic physical activities 5 times a week and weightlifting every 2 days.  Merrily Brittle un placer verle hoy!  Voy a informarle de sus [employer].

## 2018-12-13 NOTE — Progress Notes (Signed)
Anita Parker Jan 09, 1981 [id number]   History:    38 y.o. [id number] Married.  Vasectomy. Children 20 yo, 10 and 34 yo.  RP:  Established patient presenting for annual gyn exam   HPI: Menstrual.'s every months with normal flow.  No breakthrough bleeding.  No pelvic pain.  No pain with intercourse.  Complains of mild odors with vaginal discharge.  Urine and bowel movements normal.  Breasts normal.  Body mass index 31.28.  Not exercising regularly.  Health labs with family physician.  Past medical history,surgical history, family history and social history were all reviewed and documented in the EPIC chart.  Gynecologic History Patient's last menstrual period was 11/21/2018. Contraception: vasectomy Last Pap: 07/2017. Results were: Negative/HPV HR negative Last mammogram: Never Bone Density: Never Colonoscopy: Never  Obstetric History OB History  Gravida Para Term Preterm AB Living  4 3 3   1 3   SAB TAB Ectopic Multiple Live Births  1       3    # Outcome Date GA Lbr Len/2nd Weight Sex Delivery Anes PTL Lv  4 SAB           3 Term     M Vag-Spont  N LIV  2 Term     F Vag-Spont  N LIV  1 Term     M Vag-Spont  N LIV     ROS: A ROS was performed and pertinent positives and negatives are included in the history.  GENERAL: No fevers or chills. HEENT: No change in vision, no earache, sore throat or sinus congestion. NECK: No pain or stiffness. CARDIOVASCULAR: No chest pain or pressure. No palpitations. PULMONARY: No shortness of breath, cough or wheeze. GASTROINTESTINAL: No abdominal pain, nausea, vomiting or diarrhea, melena or bright red blood per rectum. GENITOURINARY: No urinary frequency, urgency, hesitancy or dysuria. MUSCULOSKELETAL: No joint or muscle pain, no back pain, no recent trauma. DERMATOLOGIC: No rash, no itching, no lesions. ENDOCRINE: No polyuria, polydipsia, no heat or cold intolerance. No recent change in weight. HEMATOLOGICAL: No anemia or easy bruising or  bleeding. NEUROLOGIC: No headache, seizures, numbness, tingling or weakness. PSYCHIATRIC: No depression, no loss of interest in normal activity or change in sleep pattern.     Exam:   BP 120/78   Ht 5\' 2"  (1.575 m)   Wt 171 lb (77.6 kg)   LMP 11/21/2018 Comment: pts husband with vasectomy  BMI 31.28 kg/m   Body mass index is 31.28 kg/m.  General appearance : Well developed well nourished female. No acute distress HEENT: Eyes: no retinal hemorrhage or exudates,  Neck supple, trachea midline, no carotid bruits, no thyroidmegaly Lungs: Clear to auscultation, no rhonchi or wheezes, or rib retractions  Heart: Regular rate and rhythm, no murmurs or gallops Breast:Examined in sitting and supine position were symmetrical in appearance, no palpable masses or tenderness,  no skin retraction, no nipple inversion, no nipple discharge, no skin discoloration, no axillary or supraclavicular lymphadenopathy Abdomen: no palpable masses or tenderness, no rebound or guarding Extremities: no edema or skin discoloration or tenderness  Pelvic: Vulva: Normal             Vagina: No gross lesions or discharge  Cervix: No gross lesions or discharge.  Pap reflex done  Uterus  AV, normal size, shape and consistency, non-tender and mobile  Adnexa  Without masses or tenderness  Anus: Normal   Assessment/Plan:  38 y.o. female for annual exam   1. Encounter for routine gynecological examination with  Papanicolaou smear of cervix Normal gynecologic exam.  Pap reflex done.  Breast exam normal.  Health labs with family physician.  2. Relies on partner vasectomy for contraception  3. Vaginal odor Wet prep normal, patient reassured.  Recommend probiotic tablet vaginally once a week for prevention. - WET PREP FOR TRICH, YEAST, CLUE  4. Class 1 obesity due to excess calories without serious comorbidity with body mass index (BMI) of 31.0 to 31.9 in adult Recommend lower calorie/carb diet such as [employer].   Aerobic physical activities 5 times a week and weightlifting every 2 days.  Princess Bruins MD, 3:13 PM 12/13/2018

## 2018-12-16 LAB — PAP IG W/ RFLX HPV ASCU

## 2018-12-18 ENCOUNTER — Encounter: Payer: Self-pay | Admitting: Internal Medicine

## 2018-12-18 ENCOUNTER — Other Ambulatory Visit: Payer: Self-pay

## 2018-12-18 ENCOUNTER — Ambulatory Visit: Payer: No Typology Code available for payment source | Admitting: Internal Medicine

## 2018-12-18 ENCOUNTER — Encounter (HOSPITAL_BASED_OUTPATIENT_CLINIC_OR_DEPARTMENT_OTHER): Payer: Self-pay

## 2018-12-18 ENCOUNTER — Ambulatory Visit (HOSPITAL_BASED_OUTPATIENT_CLINIC_OR_DEPARTMENT_OTHER)
Admission: RE | Admit: 2018-12-18 | Discharge: 2018-12-18 | Disposition: A | Discharge status: Home / Self Care | Payer: No Typology Code available for payment source | Source: Ambulatory Visit | Attending: Internal Medicine | Admitting: Internal Medicine

## 2018-12-18 VITALS — BP 111/62 | HR 75 | Temp 99.1°F | Resp 16 | Ht 62.0 in | Wt 169.4 lb

## 2018-12-18 DIAGNOSIS — R1031 Right lower quadrant pain: Secondary | ICD-10-CM

## 2018-12-18 LAB — CBC WITH DIFFERENTIAL/PLATELET
Basophils Absolute: 0 10*3/uL (ref 0.0–0.1)
Basophils Relative: 0.5 % (ref 0.0–3.0)
Eosinophils Absolute: 0 10*3/uL (ref 0.0–0.7)
Eosinophils Relative: 0.8 % (ref 0.0–5.0)
HCT: 37.8 % (ref 36.0–46.0)
Hemoglobin: 12.7 g/dL (ref 12.0–15.0)
Lymphocytes Relative: 38.4 % (ref 12.0–46.0)
Lymphs Abs: 2.1 10*3/uL (ref 0.7–4.0)
MCHC: 33.6 g/dL (ref 30.0–36.0)
MCV: 88.8 fl (ref 78.0–100.0)
Monocytes Absolute: 0.4 10*3/uL (ref 0.1–1.0)
Monocytes Relative: 6.8 % (ref 3.0–12.0)
Neutro Abs: 2.9 10*3/uL (ref 1.4–7.7)
Neutrophils Relative %: 53.5 % (ref 43.0–77.0)
Platelets: 226 10*3/uL (ref 150.0–400.0)
RBC: 4.26 Mil/uL (ref 3.87–5.11)
RDW: 12.9 % (ref 11.5–15.5)
WBC: 5.4 10*3/uL (ref 4.0–10.5)

## 2018-12-18 LAB — COMPREHENSIVE METABOLIC PANEL
ALT: 23 U/L (ref 0–35)
AST: 21 U/L (ref 0–37)
Albumin: 4.4 g/dL (ref 3.5–5.2)
Alkaline Phosphatase: 43 U/L (ref 39–117)
BUN: 11 mg/dL (ref 6–23)
CO2: 28 mEq/L (ref 19–32)
Calcium: 9.5 mg/dL (ref 8.4–10.5)
Chloride: 105 mEq/L (ref 96–112)
Creatinine, Ser: 0.75 mg/dL (ref 0.40–1.20)
GFR: 86.6 mL/min (ref >60.00)
Glucose, Bld: 90 mg/dL (ref 70–99)
Potassium: 3.7 mEq/L (ref 3.5–5.1)
Sodium: 139 mEq/L (ref 135–145)
Total Bilirubin: 0.4 mg/dL (ref 0.2–1.2)
Total Protein: 7.2 g/dL (ref 6.0–8.3)

## 2018-12-18 LAB — POC URINALSYSI DIPSTICK (AUTOMATED)
Bilirubin, UA: NEGATIVE
Blood, UA: NEGATIVE
Glucose, UA: NEGATIVE
Ketones, UA: NEGATIVE
Leukocytes, UA: NEGATIVE
Nitrite, UA: NEGATIVE
Protein, UA: NEGATIVE
Spec Grav, UA: 1.015 (ref 1.010–1.025)
Urobilinogen, UA: 0.2 E.U./dL
pH, UA: 6.5 (ref 5.0–8.0)

## 2018-12-18 LAB — POCT URINE PREGNANCY: Preg Test, Ur: NEGATIVE

## 2018-12-18 MED ORDER — IOHEXOL 300 MG/ML  SOLN
100.0000 mL | Freq: Once | INTRAMUSCULAR | Status: AC | PRN
  Administered 2018-12-18: 100 mL via INTRAVENOUS

## 2018-12-18 NOTE — Progress Notes (Signed)
Subjective:    Patient ID: Anita Parker, female    DOB: 10/20/1980, 38 y.o.   MRN: [id number]  DOS:  12/18/2018 Type of visit - description: Acute Symptoms started 3 days ago with right-sided abdominal pain, at the upper lower side. Pain is a steady, increase and decreases in intensity.  No change by food. When she eats she did report upper abdominal bloating.   Review of Systems No fever chills Appetite is normal No nausea, vomiting or diarrhea.  No blood in the stools.  No constipation. No dysuria gross hematuria No cough  Past Medical History:  Diagnosis Date  . Birth control    husband's vasectomy  . Condyloma acuminatum of vulva   . MOLE 04/04/2010    Past Surgical History:  Procedure Laterality Date  . NO PAST SURGERIES      Social History   Socioeconomic History  . Marital status: Married    Spouse name: Not on file  . Number of children: 3  . Years of education: Not on file  . Highest education level: Not on file  Occupational History  . Occupation: [employer], Comptroller: [employer]  Social Needs  . Financial resource strain: Not on file  . Food insecurity    Worry: Not on file    Inability: Not on file  . Transportation needs    Medical: Not on file    Non-medical: Not on file  Tobacco Use  . Smoking status: Never Smoker  . Smokeless tobacco: Never Used  Substance and Sexual Activity  . Alcohol use: No    Alcohol/week: 0.0 standard drinks  . Drug use: No  . Sexual activity: Yes    Partners: Male    Comment: patient's partner with vasectomy, INTERCOURSE AGE 62, SEXUAL PARTNERS LESS THAN 5  Lifestyle  . Physical activity    Days per week: Not on file    Minutes per session: Not on file  . Stress: Not on file  Relationships  . Social Herbalist on phone: Not on file    Gets together: Not on file    Attends religious service: Not on file    Active member of club or organization: Not on file    Attends meetings of clubs or organizations: Not on file    Relationship status: Not on file  . Intimate partner violence    Fear of current or ex partner: Not on file    Emotionally abused: Not on file    Physically abused: Not on file    Forced sexual activity: Not on file  Other Topics Concern  . Not on file  Social History Narrative   From [place], 4 children, 3 living ones   Household- pt, husband, 2  children   Boy 38 years old, girl 29 years old      Allergies as of 12/18/2018   No Known Allergies     Medication List       Accurate as of December 18, 2018  1:25 PM. If you have any questions, ask your nurse or doctor.        cholecalciferol 1000 units tablet Commonly known as: VITAMIN D Take 1,000 Units by mouth daily.   multivitamin tablet Take 1 tablet by mouth daily.           Objective:   Physical Exam BP 111/62 (BP Location: Left Arm, Patient Position: Sitting, Cuff Size: Small)   Pulse 75  Temp 99.1 F (37.3 C) (Oral)   Resp 16   Ht 5\' 2"  (1.575 m)   Wt 169 lb 6 oz (76.8 kg)   LMP 11/21/2018 Comment: pts husband with vasectomy  SpO2 100%   BMI 30.98 kg/m  General:   Well developed, NAD, BMI noted.  HEENT:  Normocephalic . Face symmetric, atraumatic Lungs:  CTA B Normal respiratory effort, no intercostal retractions, no accessory muscle use. Heart: RRR,  no murmur.  no pretibial edema bilaterally  Abdomen:  Not distended, soft, + TTP without mass or rebound at both the right upper quadrant and right lower quadrant. No CVA tenderness Skin: Not pale. Not jaundice Neurologic:  alert & oriented X3.  Speech normal, gait appropriate for age and unassisted Psych--  Cognition and judgment appear intact.  Cooperative with normal attention span and concentration.  Behavior appropriate. No anxious or depressed appearing.     Assessment     Assessment Mole 2011 Condyloma, vulvar Birth control, husband's vasectomy Varicose veins Low vit  D   PLAN   Right-sided abdominal pain: Symptoms started 3 days ago, no fever chills, DDX includes early/atypical appendicitis versus gallbladder stones versus others. Udip negative, UPT negative. Plan: CMP, CBC, proceed with abdomen and pelvis CT with and without.  If CT is negative, proceed with gallbladder ultrasound. ER if symptoms increase Addendum: Blood work normal, CT negative but early appendicitis is not strictly excluded.  I called the patient at around 5:15 PM.  Recommend to observe carefully, ER if severe pain or fever and chills.  Will check on her tomorrow 12/19/2018.   Consider redo CT versus ultrasound to assess GB.

## 2018-12-18 NOTE — Patient Instructions (Addendum)
Please go to the first floor and get a CAT scan done.  If you feel much worse, call the office.  VAYA AL PRIMER PISO Y PROCEDA CON EL CAT SCAN  SI SE SIENTE PEOR, LLAME A LA OFICINA O VAYA A LA EMERGENCIA

## 2018-12-18 NOTE — Progress Notes (Signed)
Pre visit review using our clinic review tool, if applicable. No additional management support is needed unless otherwise documented below in the visit note. 

## 2018-12-19 ENCOUNTER — Telehealth: Payer: Self-pay | Admitting: Internal Medicine

## 2018-12-19 NOTE — Telephone Encounter (Signed)
LMOM Called to check on her, advised ER if increasing abdominal pain, fever or chills. We will try to check on her again later.

## 2018-12-19 NOTE — Assessment & Plan Note (Signed)
Right-sided abdominal pain: Symptoms started 3 days ago, no fever chills, DDX includes early/atypical appendicitis versus gallbladder stones versus others. Udip negative, UPT negative. Plan: CMP, CBC, proceed with abdomen and pelvis CT with and without.  If CT is negative, proceed with gallbladder ultrasound. ER if symptoms increase Addendum: Blood work normal, CT negative but early appendicitis is not strictly excluded.  I called the patient at around 5:15 PM.  Recommend to observe carefully, ER if severe pain or fever and chills.  Will check on her tomorrow 12/19/2018.  Consider redo CT versus ultrasound to assess GB.

## 2018-12-20 NOTE — Telephone Encounter (Signed)
LMOM: If she is feeling better, she is advised to contact me next week, I will proceed with a GB ultrasound. If she is worse, fever, chills, increased pain: ER

## 2018-12-23 ENCOUNTER — Telehealth: Payer: Self-pay | Admitting: Internal Medicine

## 2018-12-23 DIAGNOSIS — R1010 Upper abdominal pain, unspecified: Secondary | ICD-10-CM

## 2018-12-23 NOTE — Telephone Encounter (Signed)
Pt still waiting for call back regarding results. She can be reached after 3pm at [phone].

## 2018-12-23 NOTE — Telephone Encounter (Signed)
Called, both numbers, no answer

## 2018-12-24 NOTE — Telephone Encounter (Signed)
Patient feels better, no fever chills, no abdominal pain. Recent abdominal pain could have be a gallbladder discomfort. Please to schedule abdominal ultrasound, DX upper abdominal pain  Also, has on and off back pain.  Recommend to call and schedule a office visit at her convenience

## 2018-12-24 NOTE — Telephone Encounter (Signed)
Order placed

## 2018-12-26 ENCOUNTER — Other Ambulatory Visit: Payer: Self-pay

## 2018-12-26 ENCOUNTER — Ambulatory Visit (HOSPITAL_BASED_OUTPATIENT_CLINIC_OR_DEPARTMENT_OTHER)
Admission: RE | Admit: 2018-12-26 | Discharge: 2018-12-26 | Disposition: A | Discharge status: Home / Self Care | Payer: No Typology Code available for payment source | Source: Ambulatory Visit | Attending: Internal Medicine | Admitting: Internal Medicine

## 2018-12-26 DIAGNOSIS — R1010 Upper abdominal pain, unspecified: Secondary | ICD-10-CM | POA: Insufficient documentation

## 2019-02-21 ENCOUNTER — Encounter: Payer: No Typology Code available for payment source | Admitting: Internal Medicine

## 2019-10-17 ENCOUNTER — Other Ambulatory Visit: Payer: Self-pay

## 2019-10-17 ENCOUNTER — Ambulatory Visit (INDEPENDENT_AMBULATORY_CARE_PROVIDER_SITE_OTHER): Payer: No Typology Code available for payment source | Admitting: Internal Medicine

## 2019-10-17 ENCOUNTER — Encounter: Payer: Self-pay | Admitting: Internal Medicine

## 2019-10-17 VITALS — BP 111/54 | HR 68 | Temp 97.5°F | Resp 16 | Ht 62.0 in | Wt 170.1 lb

## 2019-10-17 DIAGNOSIS — E559 Vitamin D deficiency, unspecified: Secondary | ICD-10-CM

## 2019-10-17 DIAGNOSIS — Z0000 Encounter for general adult medical examination without abnormal findings: Secondary | ICD-10-CM | POA: Diagnosis not present

## 2019-10-17 DIAGNOSIS — Z23 Encounter for immunization: Secondary | ICD-10-CM

## 2019-10-17 LAB — CBC WITH DIFFERENTIAL/PLATELET
Basophils Absolute: 0 10*3/uL (ref 0.0–0.1)
Basophils Relative: 0.4 % (ref 0.0–3.0)
Eosinophils Absolute: 0.1 10*3/uL (ref 0.0–0.7)
Eosinophils Relative: 1.1 % (ref 0.0–5.0)
HCT: 38.3 % (ref 36.0–46.0)
Hemoglobin: 13 g/dL (ref 12.0–15.0)
Lymphocytes Relative: 35.2 % (ref 12.0–46.0)
Lymphs Abs: 1.9 10*3/uL (ref 0.7–4.0)
MCHC: 33.8 g/dL (ref 30.0–36.0)
MCV: 87.9 fl (ref 78.0–100.0)
Monocytes Absolute: 0.4 10*3/uL (ref 0.1–1.0)
Monocytes Relative: 8 % (ref 3.0–12.0)
Neutro Abs: 3 10*3/uL (ref 1.4–7.7)
Neutrophils Relative %: 55.3 % (ref 43.0–77.0)
Platelets: 209 10*3/uL (ref 150.0–400.0)
RBC: 4.36 Mil/uL (ref 3.87–5.11)
RDW: 12.4 % (ref 11.5–15.5)
WBC: 5.5 10*3/uL (ref 4.0–10.5)

## 2019-10-17 LAB — COMPREHENSIVE METABOLIC PANEL WITH GFR
ALT: 14 U/L (ref 0–35)
AST: 17 U/L (ref 0–37)
Albumin: 4.3 g/dL (ref 3.5–5.2)
Alkaline Phosphatase: 40 U/L (ref 39–117)
BUN: 9 mg/dL (ref 6–23)
CO2: 27 meq/L (ref 19–32)
Calcium: 8.9 mg/dL (ref 8.4–10.5)
Chloride: 105 meq/L (ref 96–112)
Creatinine, Ser: 0.73 mg/dL (ref 0.40–1.20)
GFR: 88.95 mL/min (ref >60.00)
Glucose, Bld: 89 mg/dL (ref 70–99)
Potassium: 3.5 meq/L (ref 3.5–5.1)
Sodium: 137 meq/L (ref 135–145)
Total Bilirubin: 0.4 mg/dL (ref 0.2–1.2)
Total Protein: 7.2 g/dL (ref 6.0–8.3)

## 2019-10-17 LAB — LIPID PANEL
Cholesterol: 150 mg/dL (ref 0–200)
HDL: 51 mg/dL (ref >39.00)
LDL Cholesterol: 85 mg/dL (ref 0–99)
NonHDL: 99.36
Total CHOL/HDL Ratio: 3
Triglycerides: 71 mg/dL (ref 0.0–149.0)
VLDL: 14.2 mg/dL (ref 0.0–40.0)

## 2019-10-17 LAB — VITAMIN D 25 HYDROXY (VIT D DEFICIENCY, FRACTURES): VITD: 21.69 ng/mL — ABNORMAL LOW (ref 30.00–100.00)

## 2019-10-17 NOTE — Progress Notes (Signed)
Pre visit review using our clinic review tool, if applicable. No additional management support is needed unless otherwise documented below in the visit note. 

## 2019-10-17 NOTE — Assessment & Plan Note (Signed)
-  Td 2011, Tdap today - Covid vaccine: reluctant, pro>cons discussed  -Female care: saw  Gyn 11/2018 - CCS: Never had a cscope , not indicated  - Labs: CMP, FLP, CBC, vitamin D -Diet discussed, she is doing very well with exercise, active at work, goes to the gym

## 2019-10-17 NOTE — Progress Notes (Signed)
Subjective:    Patient ID: Anita Parker, female    DOB: 12-10-1980, 39 y.o.   MRN: [id number]  DOS:  10/17/2019 Type of visit - description: CPX Since the last office visit she is doing well  She is very active at work . Reports occasional neck pain, bilateral, close to the nuchal area, no radiation, no paresthesias. No injury.  Also for the last 3 weeks is having pain in the calves, symmetric pain , denies swelling.  No claudication.   Review of Systems  Other than above, a 14 point review of systems is negative     Past Medical History:  Diagnosis Date  . Birth control    husband's vasectomy  . Condyloma acuminatum of vulva   . MOLE 04/04/2010    Past Surgical History:  Procedure Laterality Date  . NO PAST SURGERIES     Family History  Problem Relation Age of Onset  . Cancer Maternal Grandmother        UTERINE  . Diabetes Neg Hx   . CAD Neg Hx   . Breast cancer Neg Hx   . Colon cancer Neg Hx     Allergies as of 10/17/2019   No Known Allergies     Medication List       Accurate as of Oct 17, 2019 11:59 PM. If you have any questions, ask your nurse or doctor.        cholecalciferol 1000 units tablet Commonly known as: VITAMIN D Take 1,000 Units by mouth daily.   multivitamin tablet Take 1 tablet by mouth daily.          Objective:   Physical Exam BP (!) 111/54 (BP Location: Left Arm, Patient Position: Sitting, Cuff Size: Small)   Pulse 68   Temp (!) 97.5 F (36.4 C) (Temporal)   Resp 16   Ht 5\' 2"  (1.575 m)   Wt 170 lb 2 oz (77.2 kg)   LMP 10/07/2019 (Exact Date)   SpO2 97%   BMI 31.12 kg/m  General: Well developed, NAD, BMI noted Neck: No  thyromegaly. No TTP at the cervical spine.  Range of motion normal HEENT:  Normocephalic . Face symmetric, atraumatic Lungs:  CTA B Normal respiratory effort, no intercostal retractions, no accessory muscle use. Heart: RRR,  no murmur.  Abdomen:  Not distended, soft, non-tender. No  rebound or rigidity.   Lower extremities:   Good pedal pulses, calves symmetric in size and slightly TTP, skin normal, no phlebitis.  No edema Skin: Exposed areas without rash. Not pale. Not jaundice Neurologic:  alert & oriented X3.  Speech normal, gait appropriate for age and unassisted Strength symmetric and appropriate for age.  Psych: Cognition and judgment appear intact.  Cooperative with normal attention span and concentration.  Behavior appropriate. No anxious or depressed appearing.     Assessment    Assessment Mole 2011 Condyloma, vulvar Birth control, husband's vasectomy Varicose veins Low vit D   PLAN  Here for CPX Neck pain, calf pain: Problem seems to be MSK and unrelated, on clinical grounds no evidence of DVT. I reviewed with her appropriate neck and calf stretching techniques and recommend to do that daily before going to work.  Call if no better Low vitamin D: On a multivitamin but quit vitamin D OTC, will restart, request levels to be checked.  Will do. RTC 1 year      This visit occurred during the SARS-CoV-2 public health emergency.  Safety protocols were in place,  including screening questions prior to the visit, additional usage of staff PPE, and extensive cleaning of exam room while observing appropriate contact time as indicated for disinfecting solutions.

## 2019-10-17 NOTE — Patient Instructions (Addendum)
COVID-19 Vaccine Information can be found at: [email or web address].uk For questions related to vaccine distribution or appointments, please email [email or web address] or call [phone].   LE RECOMIENDO QUE SE PONGA LA VACUNA DE COVID DEBE ESPERAR 2 SEMANAS A PARTIR DE HOY YA QUE HA RECIBIDO LA VACUNA DE TETANOS  GO TO THE LAB : Get the blood work     GO TO THE FRONT DESK, [place] Come back for a physical exam in 1 year

## 2019-10-19 NOTE — Assessment & Plan Note (Signed)
Here for CPX Neck pain, calf pain: Problem seems to be MSK and unrelated, on clinical grounds no evidence of DVT. I reviewed with her appropriate neck and calf stretching techniques and recommend to do that daily before going to work.  Call if no better Low vitamin D: On a multivitamin but quit vitamin D OTC, will restart, request levels to be checked.  Will do. RTC 1 year

## 2019-10-21 ENCOUNTER — Other Ambulatory Visit: Payer: Self-pay

## 2019-10-21 MED ORDER — VITAMIN D (ERGOCALCIFEROL) 1.25 MG (50000 UNIT) PO CAPS: 50000.0000 [IU] | ORAL_CAPSULE | ORAL | 0 refills | Status: DC

## 2019-10-21 NOTE — Addendum Note (Signed)
Addended byDamita Dunnings D on: 10/21/2019 07:48 AM   Modules accepted: Orders

## 2020-01-04 ENCOUNTER — Telehealth: Payer: Self-pay | Admitting: Internal Medicine

## 2020-01-04 DIAGNOSIS — R932 Abnormal findings on diagnostic imaging of liver and biliary tract: Secondary | ICD-10-CM

## 2020-01-04 NOTE — Telephone Encounter (Signed)
Due for a liver ultrasound.  Okay to order RUQ ultrasound, DX abnormal liver ultrasound

## 2020-01-05 NOTE — Telephone Encounter (Signed)
Orders placed.

## 2020-02-03 ENCOUNTER — Encounter: Payer: No Typology Code available for payment source | Admitting: Obstetrics & Gynecology

## 2020-02-03 DIAGNOSIS — Z0289 Encounter for other administrative examinations: Secondary | ICD-10-CM

## 2020-04-30 ENCOUNTER — Other Ambulatory Visit: Payer: Self-pay

## 2020-04-30 ENCOUNTER — Ambulatory Visit (INDEPENDENT_AMBULATORY_CARE_PROVIDER_SITE_OTHER): Payer: No Typology Code available for payment source | Admitting: Internal Medicine

## 2020-04-30 ENCOUNTER — Encounter: Payer: Self-pay | Admitting: Internal Medicine

## 2020-04-30 VITALS — BP 111/76 | HR 88 | Temp 97.8°F | Ht 62.0 in | Wt 166.2 lb

## 2020-04-30 DIAGNOSIS — M542 Cervicalgia: Secondary | ICD-10-CM

## 2020-04-30 NOTE — Addendum Note (Signed)
Addended by: Damita Dunnings D on: 04/30/2020 11:16 AM   Modules accepted: Orders

## 2020-04-30 NOTE — Progress Notes (Signed)
   Subjective:    Patient ID: Anita Parker, female    DOB: 1980-07-19, 39 y.o.   MRN: [id number]  DOS:  04/30/2020 Type of visit - description: Acute Having neck pain for several months, happens every day. Only when the pain is severe she takes Tylenol with some help. The pain is located at the posterior neck. She is concerned because she also has some pain located at the right side of the middle back and occasional pain at both calfs and wondered if those are related with the neck.  She denies any injury or difficulty with her gait. No bladder or bowel incontinence No upper extremity paresthesias Occasionally her feet get tingly when she is sitting for prolonged period of time. Occasionally has cramps on both calves No headaches per se.  Review of Systems See above   Past Medical History:  Diagnosis Date  . Birth control    husband's vasectomy  . Condyloma acuminatum of vulva   . MOLE 04/04/2010    Past Surgical History:  Procedure Laterality Date  . NO PAST SURGERIES      Allergies as of 04/30/2020   No Known Allergies     Medication List       Accurate as of April 30, 2020 11:59 PM. If you have any questions, ask your nurse or doctor.        cholecalciferol 1000 units tablet Commonly known as: VITAMIN D Take 1,000 Units by mouth daily.   multivitamin tablet Take 1 tablet by mouth daily.   Vitamin D (Ergocalciferol) 1.25 MG (50000 UNIT) Caps capsule Commonly known as: DRISDOL Take 1 capsule (50,000 Units total) by mouth every 7 (seven) days.          Objective:   Physical Exam Neck:   Musculoskeletal:       Arms:    BP 111/76 (BP Location: Right Arm, Patient Position: Sitting, Cuff Size: Large)   Pulse 88   Temp 97.8 F (36.6 C) (Oral)   Ht 5\' 2"  (1.575 m)   Wt 166 lb 3.2 oz (75.4 kg)   SpO2 98%   BMI 30.40 kg/m  General:   Well developed, NAD, BMI noted. HEENT:  Normocephalic . Face symmetric, atraumatic Neck: Range of  motion is normal, slightly TTP throughout the neck and upper back. MSK: See graphic. Lower extremities: no pretibial edema bilaterally  Skin: Not pale. Not jaundice Neurologic:  alert & oriented X3.  Speech normal, gait appropriate for age and unassisted Motor symmetric, DTR symmetric, gait normal Psych--  Cognition and judgment appear intact.  Cooperative with normal attention span and concentration.  Behavior appropriate. No anxious or depressed appearing.      Assessment     Assessment Mole 2011 Condyloma, vulvar Birth control, husband's vasectomy Varicose veins Low vit D   PLAN  Neck pain: As described above, seems MSK issue rather than a radiculopathy or myelopathy however the patient is quite concerned about the associated lower extremity cramps and right mid back pain. We talk about medications versus referral, she elected referral, referred to Ortho. Abnormal liver ultrasound: Schedule a ultrasound.  Cost is an issue. Preventive care: Encouraged to proceed with vaccinations   This visit occurred during the SARS-CoV-2 public health emergency.  Safety protocols were in place, including screening questions prior to the visit, additional usage of staff PPE, and extensive cleaning of exam room while observing appropriate contact time as indicated for disinfecting solutions.

## 2020-04-30 NOTE — Patient Instructions (Signed)
We are referring you to the orthopedic doctor for neck pain  We are ordering ultrasound of the liver at [facility] located in [facility] in [place].  If you do not hear from them in the next few days please let us know

## 2020-05-01 NOTE — Assessment & Plan Note (Signed)
Neck pain: As described above, seems MSK issue rather than a radiculopathy or myelopathy however the patient is quite concerned about the associated lower extremity cramps and right mid back pain. We talk about medications versus referral, she elected referral, referred to Ortho. Abnormal liver ultrasound: Schedule a ultrasound.  Cost is an issue. Preventive care: Encouraged to proceed with vaccinations

## 2020-05-20 ENCOUNTER — Other Ambulatory Visit: Payer: No Typology Code available for payment source

## 2020-05-25 ENCOUNTER — Other Ambulatory Visit: Payer: No Typology Code available for payment source

## 2020-05-29 ENCOUNTER — Ambulatory Visit (HOSPITAL_BASED_OUTPATIENT_CLINIC_OR_DEPARTMENT_OTHER): Payer: No Typology Code available for payment source

## 2020-06-01 NOTE — Progress Notes (Signed)
40 y.o. [id number] Married Other or two or more races female here for annual exam.     Interpretor:  Milly Lowdermilts  Reports vaginal discharge, most noticeable after period. Off/on for 4 months. Associated with  Mild odor, yellow color Denies irritation, itching   Has always had issues with feeling a poking inside when she has intercourse.  Patient's last menstrual period was 05/18/2020 (exact date).          Sexually active: Yes.    The current method of family planning is vasectomy.    Exercising: No.  The patient does not participate in regular exercise at present. Smoker:  no  Health Maintenance: Pap:  12-13-2018 neg History of abnormal Pap:  Per patient yes, yrs ago MMG:  Bilateral & rt breast u/s 2013 neg Colonoscopy:  none BMD:   none TDaP:  2021 Gardasil:   unsure Covid-19: not done Hep C testing: neg 2019 Screening Labs: recently done with PCP   reports that she has never smoked. She has never used smokeless tobacco. She reports that she does not drink alcohol and does not use drugs.  Past Medical History:  Diagnosis Date  . Birth control    husband's vasectomy  . Condyloma acuminatum of vulva   . MOLE 04/04/2010    Past Surgical History:  Procedure Laterality Date  . NO PAST SURGERIES      Current Outpatient Medications  Medication Sig Dispense Refill  . Multiple Vitamin (MULTIVITAMIN) tablet Take 1 tablet by mouth daily.    Marland Kitchen VITAMIN D PO Take by mouth.     No current facility-administered medications for this visit.    Family History  Problem Relation Age of Onset  . Cancer Maternal Grandmother        UTERINE  . Diabetes Neg Hx   . CAD Neg Hx   . Breast cancer Neg Hx   . Colon cancer Neg Hx     Review of Systems  Constitutional: Negative.   HENT: Negative.   Eyes: Negative.   Respiratory: Negative.   Cardiovascular: Negative.   Gastrointestinal: Negative.   Endocrine: Negative.   Genitourinary:       Occ yellow discharge a few days after  menstrual cycle  Musculoskeletal: Negative.   Skin: Negative.   Allergic/Immunologic: Negative.   Neurological: Negative.   Hematological: Negative.   Psychiatric/Behavioral: Negative.     Exam:   Ht 5' 2.75" (1.594 m)   Wt 166 lb (75.3 kg)   LMP 05/18/2020 (Exact Date)   BMI 29.64 kg/m   Height: 5' 2.75" (159.4 cm)  General appearance: alert, cooperative and appears stated age, no acute distress Head: Normocephalic, without obvious abnormality Neck: no adenopathy, thyroid normal to inspection and palpation Lungs: clear to auscultation bilaterally Breasts: normal appearance, no masses or tenderness Heart: regular rate and rhythm Abdomen: soft, non-tender; no masses,  no organomegaly Extremities: extremities normal, no edema Skin: No rashes or lesions Lymph nodes: Cervical, supraclavicular, and axillary nodes normal. No abnormal inguinal nodes palpated Neurologic: Grossly normal   Pelvic: External genitalia:  no lesions              Urethra:  normal appearing urethra with no masses, tenderness or lesions              Bartholins and Skenes: normal                 Vagina: normal appearing vagina, appropriate for age, moderate amount yellow appearing discharge, no lesions  Cervix: neg cervical motion tenderness, no visible lesions             Bimanual Exam:   Uterus:  enlarged, 10 weeks size and firm              Adnexa: no mass, fullness, tenderness                 Joy, CMA Chaperone was present for exam.  A:  Well Woman with normal exam  Mildly enlarged uterus, mild prolpase  Well woman exam with routine gynecological exam  Vaginal discharge - Plan: WET PREP FOR [place], YEAST, CLUE, C. trachomatis/N. gonorrhoeae RNA  BV (bacterial vaginosis) - Plan: metroNIDAZOLE (FLAGYL) 500 MG tablet  Screen for STD (sexually transmitted disease)    P:   Pap :UTD, last pap (no HPV test) done 2020, next pap due 2023  Labs:wet mount, GC/CT  Medications:  metronidazole

## 2020-06-02 ENCOUNTER — Other Ambulatory Visit: Payer: Self-pay | Admitting: Internal Medicine

## 2020-06-02 ENCOUNTER — Ambulatory Visit (HOSPITAL_BASED_OUTPATIENT_CLINIC_OR_DEPARTMENT_OTHER)
Admission: RE | Admit: 2020-06-02 | Discharge: 2020-06-02 | Disposition: A | Discharge status: Home / Self Care | Payer: No Typology Code available for payment source | Source: Ambulatory Visit | Attending: Internal Medicine | Admitting: Internal Medicine

## 2020-06-02 ENCOUNTER — Other Ambulatory Visit: Payer: Self-pay

## 2020-06-02 DIAGNOSIS — R932 Abnormal findings on diagnostic imaging of liver and biliary tract: Secondary | ICD-10-CM

## 2020-06-04 ENCOUNTER — Encounter: Payer: Self-pay | Admitting: Nurse Practitioner

## 2020-06-04 ENCOUNTER — Other Ambulatory Visit: Payer: Self-pay

## 2020-06-04 ENCOUNTER — Ambulatory Visit: Payer: No Typology Code available for payment source | Admitting: Nurse Practitioner

## 2020-06-04 VITALS — BP 110/72 | HR 68 | Resp 16 | Ht 62.75 in | Wt 166.0 lb

## 2020-06-04 DIAGNOSIS — Z113 Encounter for screening for infections with a predominantly sexual mode of transmission: Secondary | ICD-10-CM | POA: Diagnosis not present

## 2020-06-04 DIAGNOSIS — Z01419 Encounter for gynecological examination (general) (routine) without abnormal findings: Secondary | ICD-10-CM | POA: Diagnosis not present

## 2020-06-04 DIAGNOSIS — N760 Acute vaginitis: Secondary | ICD-10-CM

## 2020-06-04 DIAGNOSIS — B9689 Other specified bacterial agents as the cause of diseases classified elsewhere: Secondary | ICD-10-CM

## 2020-06-04 DIAGNOSIS — N898 Other specified noninflammatory disorders of vagina: Secondary | ICD-10-CM | POA: Diagnosis not present

## 2020-06-04 LAB — WET PREP FOR TRICH, YEAST, CLUE

## 2020-06-04 MED ORDER — METRONIDAZOLE 500 MG PO TABS: 500.0000 mg | ORAL_TABLET | Freq: Two times a day (BID) | ORAL | 0 refills | Status: DC

## 2020-06-04 NOTE — Patient Instructions (Signed)
Vaginosis bacteriana Bacterial Vaginosis  La vaginosis bacteriana es una infeccin de la vagina. Se produce cuando crece una cantidad excesiva de grmenes normales (bacterias sanas) en la vagina. Esta infeccin puede facilitar contraer otras infecciones por las relaciones sexuales (infecciones de transmisin sexual, ITS). Es muy importante que las mujeres embarazadas reciban tratamiento. Esta infeccin puede hacer que los bebs nazcan antes de tiempo o tengan un bajo peso al nacer. Cules son las causas? La causa de esta infeccin es el aumento de ciertas bacterias que crecen en la vagina. No se puede contraer esta infeccin en los asientos de inodoros, en las sbanas, en las piscinas ni en los objetos que entran en contacto con la vagina. Qu incrementa el riesgo?  Tener relaciones sexuales con una persona nueva o con ms de una persona.  Tener relaciones sexuales sin proteccin.  Hacerse duchas vaginales.  Tener colocado un dispositivo intrauterino(DIU).  Fumar.  Consumir drogas o beber alcohol. Esto puede llevarla a hacer cosas que son riesgosas.  Tomar ciertos antibiticos.  Estar embarazada. Cules son los signos o sntomas? Algunas mujeres no tienen sntomas. Entre los sntomas, se pueden incluir los siguientes:  Secrecin de la vagina. Puede ser gris o blanca. Puede ser acuosa o espumosa.  Olor a pescado. Esto puede ocurrir despus de tener relaciones sexuales o durante el perodo menstrual.  Picazn en la vagina y a su alrededor.  Sensacin de ardor o dolor al hacer pis (orinar). Cmo se trata? Esta infeccin se trata con antibiticos. Estos pueden administrarse en las siguientes presentaciones:  [place].  Crema para la vagina.  Medicamento que se coloca dentro de la vagina (vulo vaginal). Si la infeccin reaparece despus del tratamiento, es posible que necesite ms antibiticos. Siga estas instrucciones en su casa: Medicamentos  Use los medicamentos de  venta libre y los recetados como se lo haya indicado el mdico.  Tome o use el antibitico como se lo haya indicado el mdico. No deje de tomarlo o usarlo aunque comience a sentirse mejor. Instrucciones generales  Si la persona con la que tiene relaciones sexuales es mujer, dgale que usted tiene esta infeccin. Ella tendr que concurrir a visitas de control con el mdico. Si tiene una pareja sexual hombre, l no necesita tratamiento.  No mantenga relaciones sexuales hasta finalizar el tratamiento.  Beba suficiente lquido como para mantener la orina de color amarillo plido.  Mantenga la vagina y el ano limpios. ? Lave la zona con agua tibia cada da. ? Cuando vaya al bao, higiencese de adelante hacia atrs.  Si est amamantando a un beb, pregunte al mdico si debera seguir hacindolo durante el tratamiento.  Cumpla con todas las visitas de seguimiento. Cmo se previene? Autocuidado  No se haga duchas vaginales.  Use nicamente agua tibia para lavar la zona alrededor de la vagina.  Use ropa interior de algodn o cuya parte de adentro sea de algodn.  No use pantalones ajustados ni pantis; en especial, durante el verano. Sexo seguro  Use proteccin al tener relaciones sexuales. Esto puede comprender lo siguiente: ? Use preservativos. ? Use barreras bucales. Estas son capas delgadas de ltex que protegen la boca durante el sexo oral.  Limite la cantidad de personas con las que tiene relaciones sexuales. Para prevenir esta infeccin, lo mejor es tener relaciones sexuales con una sola persona.  Hgase exmenes de ITS. Tambin debe hacerse los exmenes la persona con quien tiene relaciones sexuales. Drogas y alcohol  No fume ni consuma ningn producto que contenga nicotina o   tabaco. Si necesita ayuda para dejar de consumir estos productos, consulte al mdico.  No consuma drogas.  No beba alcohol si: ? El mdico le indica que no lo haga. ? Est embarazada, puede estar  embarazada o est tratando de quedar embarazada.  Si bebe alcohol: ? Limite la cantidad que consume de 0 a 1 medida por da. ? Sepa cunta cantidad de alcohol hay en las bebidas que toma. En los [place], una medida equivale a una botella de cerveza de 12oz ( ), un vaso de vino de 5oz ( ) o un vaso de una bebida alcohlica de alta graduacin de 1oz (30ml). Dnde buscar ms informacin  Centers for Disease Control and Prevention (Centros para el Control y la Prevencin de Event organiser): FootballExhibition.com.br  American Sexual Health Association (Asociacin Estadounidense de la Salud Sexual): www.ashastd.org  Office on [employer] (Oficina para la Salud de la Mujer): TravelLesson.ca Comunquese con un mdico si:  Los sntomas no mejoran, incluso despus de Medical illustrator.  Tiene ms secrecin o siente dolor al hacer pis.  Tiene fiebre o escalofros.  Tiene dolor en el vientre (abdomen) o en la zona que se encuentra entre las caderas.  Siente dolor durante el sexo.  Le sangra la vagina entre perodos menstruales. Resumen  Esta infeccin puede ocurrir cuando crecen demasiados grmenes (bacterias) en la vagina.  Esta infeccin puede facilitar contraer infecciones por las relaciones sexuales (infecciones de transmisin sexual, ITS). El tratamiento puede disminuir esa posibilidad.  Reciba tratamiento si est embarazada. Esta infeccin puede hacer que los bebs nazcan antes de [place].  No deje de tomar ni de usar el antibitico aunque comience a sentirse mejor. Esta informacin no tiene Theme park manager el consejo del mdico. Asegrese de hacerle al mdico cualquier pregunta que tenga. Document Revised: 12/10/2019 Document Reviewed: 12/10/2019 Elsevier Patient Education  2021 Elsevier Inc.   Mantenimiento de la salud en las mujeres Health Maintenance, Female Adoptar un estilo de vida saludable y recibir atencin preventiva son importantes para promover la  salud y Counsellor. Consulte al mdico sobre:  El esquema adecuado para hacerse pruebas y exmenes peridicos.  Cosas que puede hacer por su cuenta para prevenir enfermedades y [employer]. Qu debo saber sobre la dieta, el peso y el ejercicio? Consuma una dieta saludable  Consuma una dieta que incluya muchas verduras, frutas, productos lcteos con bajo contenido de [place] y Associate Professor.  No consuma muchos alimentos ricos en grasas slidas, azcares agregados o sodio.   Mantenga un peso saludable El ndice de masa muscular [facility]) se [place] para identificar problemas de [place]. Proporciona una estimacin de la grasa corporal basndose en el peso y la altura. Su mdico puede ayudarle a Engineer, site IMC y a Personnel officer o Pharmacologist un peso saludable. Haga ejercicio con regularidad Haga ejercicio con regularidad. Esta es una de las prcticas ms importantes que puede hacer por su salud. La mayora de los adultos deben seguir estas pautas:  Education officer, environmental, al menos, de actividad fsica por semana. El ejercicio debe aumentar la frecuencia cardaca y Media planner transpirar (ejercicio de intensidad moderada).  Hacer ejercicios de fortalecimiento por lo [employer] por semana. Agregue esto a su plan de ejercicio de intensidad moderada.  Pasar menos tiempo sentados. Incluso la actividad fsica ligera puede ser beneficiosa. Controle sus niveles de colesterol y lpidos en la sangre Comience a realizarse anlisis de lpidos y Oncologist en la sangre a los 20aos y luego reptalos cada 5aos. Hgase controlar los niveles de colesterol con mayor  frecuencia si:  Sus niveles de lpidos y colesterol son altos.  Es mayor de 40aos.  Presenta un alto riesgo de padecer enfermedades cardacas. Qu debo saber sobre las pruebas de deteccin del cncer? Segn su historia clnica y sus antecedentes familiares, es posible que deba realizarse pruebas de deteccin del cncer en diferentes edades. Esto  puede incluir pruebas de deteccin de lo siguiente:  Cncer de mama.  Cncer de cuello uterino.  Cncer colorrectal.  Cncer de piel.  Cncer de pulmn. Qu debo saber sobre la enfermedad cardaca, la diabetes y la hipertensin arterial? Presin arterial y enfermedad cardaca  La hipertensin arterial causa enfermedades cardacas y [place] el riesgo de accidente cerebrovascular. Es ms probable que esto se manifieste en las personas que tienen lecturas de presin arterial alta, tienen ascendencia africana o tienen sobrepeso.  Hgase controlar la presin arterial: ? Cada 3 a 5 aos si tiene entre 18 y 54 aos. ? Todos los aos si es mayor de [place]. Diabetes Realcese exmenes de deteccin de la diabetes con regularidad. Este anlisis revisa el nivel de azcar en la sangre en [place]. Hgase las pruebas de deteccin:  Cada tresaos despus de los 9aos de edad si tiene un peso normal y un bajo riesgo de padecer diabetes.  Con ms frecuencia y a partir de [place] edad inferior si tiene sobrepeso o un alto riesgo de padecer diabetes. Qu debo saber sobre la prevencin de infecciones? Hepatitis B Si tiene un riesgo ms alto de contraer hepatitis B, debe someterse a un examen de deteccin de este virus. Hable con el mdico para averiguar si tiene riesgo de contraer la infeccin por hepatitis B. Hepatitis C Se recomienda el anlisis a:  [employer] 1945 y 1965.  Todas las personas que tengan un riesgo de haber contrado hepatitis C. Enfermedades de transmisin sexual (ETS)  Hgase las pruebas de Programme researcher, broadcasting/film/video de ITS, incluidas la gonorrea y la clamidia, si: ? Es sexualmente activa y es menor de [place]. ? Es mayor de 24aos, y Investment banker, operational informa que corre riesgo de tener este tipo de infecciones. ? La actividad sexual ha cambiado desde que le hicieron la ltima prueba de deteccin y tiene un riesgo mayor de Best boy clamidia o Radio broadcast assistant. Pregntele al mdico si usted  tiene riesgo.  Pregntele al mdico si usted tiene un alto riesgo de Museum/gallery curator VIH. El mdico tambin puede recomendarle un medicamento recetado para ayudar a evitar la infeccin por el VIH. Si elige tomar medicamentos para prevenir el VIH, primero debe [employer] de deteccin del VIH. Luego debe hacerse anlisis cada 69meses mientras est tomando los medicamentos. Embarazo  Si est por dejar de Librarian, academic (fase premenopusica) y usted puede quedar Regino Ramirez, busque asesoramiento antes de [place].  Tome de 400 a [id number] (mcg) de cido [employer] si [place].  Pida mtodos de control de la natalidad (anticonceptivos) si desea evitar un embarazo no deseado. Osteoporosis y [place] La osteoporosis es una enfermedad en la que los huesos pierden los minerales y la fuerza por el avance de la edad. El resultado pueden ser fracturas en los [place]. Si tiene 65aos o ms, o si est en riesgo de sufrir osteoporosis y fracturas, pregunte a su mdico si debe:  Hacerse pruebas de deteccin de prdida sea.  Tomar un suplemento de calcio o de vitamina D para reducir el riesgo de fracturas.  Recibir terapia de reemplazo hormonal (TRH) para tratar los sntomas de la menopausia. Siga estas instrucciones  en su casa: Estilo de vida  No consuma ningn producto que contenga nicotina o tabaco, como cigarrillos, cigarrillos electrnicos y tabaco de Higher education careers adviser. Si necesita ayuda para dejar de fumar, consulte al mdico.  No consuma drogas.  No comparta agujas.  Solicite ayuda a su mdico si necesita apoyo o informacin para abandonar las drogas. Consumo de alcohol  No beba alcohol si: ? Su mdico le indica no hacerlo. ? Est embarazada, puede estar embarazada o est tratando de quedar embarazada.  Si bebe alcohol: ? Limite la cantidad que consume de 0 a 1 medida por da. ? Limite la ingesta si est amamantando.  Est atento a la cantidad de alcohol que hay en  las bebidas que toma. En los [place], una medida equivale a una botella de cerveza de 12oz (378ml), un vaso de vino de 5oz (110ml) o un vaso de una bebida alcohlica de alta graduacin de 1oz (37ml). Instrucciones generales  Realcese los estudios de rutina de la salud, dentales y de Public librarian.  [place].  Infrmele a su mdico si: ? Se siente deprimida con frecuencia. ? Alguna vez ha sido vctima de [place] o no se siente segura en su casa. Resumen  Adoptar un estilo de vida saludable y recibir atencin preventiva son importantes para promover la salud y Musician.  Siga las instrucciones del mdico acerca de una dieta saludable, el ejercicio y la realizacin de pruebas o exmenes para Engineer, building services.  Siga las instrucciones del mdico con respecto al control del colesterol y la presin arterial. Esta informacin no tiene Marine scientist el consejo del mdico. Asegrese de hacerle al mdico cualquier pregunta que tenga. Document Revised: 05/29/2018 Document Reviewed: 05/29/2018 Elsevier Patient Education  [place].

## 2020-06-05 LAB — C. TRACHOMATIS/N. GONORRHOEAE RNA
C. trachomatis RNA, TMA: NOT DETECTED
N. gonorrhoeae RNA, TMA: NOT DETECTED

## 2020-07-23 ENCOUNTER — Ambulatory Visit (HOSPITAL_COMMUNITY)
Admission: EM | Admit: 2020-07-23 | Discharge: 2020-07-23 | Disposition: A | Discharge status: Home / Self Care | Payer: No Typology Code available for payment source

## 2020-07-23 ENCOUNTER — Encounter (HOSPITAL_COMMUNITY): Payer: Self-pay

## 2020-07-23 ENCOUNTER — Ambulatory Visit: Payer: No Typology Code available for payment source | Admitting: Internal Medicine

## 2020-07-23 ENCOUNTER — Other Ambulatory Visit: Payer: Self-pay

## 2020-07-23 DIAGNOSIS — M5441 Lumbago with sciatica, right side: Secondary | ICD-10-CM

## 2020-07-23 MED ORDER — IBUPROFEN 800 MG PO TABS: 800.0000 mg | ORAL_TABLET | Freq: Three times a day (TID) | ORAL | 0 refills | Status: DC | PRN

## 2020-07-23 MED ORDER — CYCLOBENZAPRINE HCL 5 MG PO TABS: 5.0000 mg | ORAL_TABLET | Freq: Three times a day (TID) | ORAL | 0 refills | Status: DC | PRN

## 2020-07-23 NOTE — ED Provider Notes (Signed)
[facility]    CSN: [id number] Arrival date & time: 07/23/20  1100      History   Chief Complaint Chief Complaint  Patient presents with  . Back Pain    HPI Anita Parker is a 40 y.o. female. Although patient and I had a good dialogue a medical interpretor was utilized to ensure all of her questions were asked and that we understood each other accurately.   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  Back pain radiates into her buttock area.  Pain is rated as 10 out of 10 recently as it has seemed to worsen with time.  Activities such as walking upstairs, sitting in chairs tend to make symptoms worse.  She has no history of injury to the back previously.  She has tried Tylenol with little relief.  She denies any numbness or tingling that radiates down the leg, numbness in the leg, loss of sensation of groin or incontinence.  She denies any urinary symptoms but did give [place] a urine today just in case. Past Medical History:  Diagnosis Date  . Birth control    husband's vasectomy  . Condyloma acuminatum of vulva   . MOLE 04/04/2010    Patient Active Problem List   Diagnosis Date Noted  . Annual physical exam 09/14/2015  . PCP NOTES >>>>>>>>>>>>>>>>>>>>>>>>> 09/14/2015  . Shoulder sprain 08/26/2012  . Condyloma acuminatum of vulva 02/22/2012  . MOLE 04/04/2010    Past Surgical History:  Procedure Laterality Date  . NO PAST SURGERIES      OB History    Gravida  4   Para  3   Term  3   Preterm      AB  1   Living  3     SAB  1   IAB      Ectopic      Multiple      Live Births  3            Home Medications    Prior to Admission medications   Medication Sig Start Date End Date Taking? Authorizing Provider  acetaminophen (TYLENOL) 500 MG tablet Take 500 mg by mouth every 6 (six) hours as needed.   Yes [provider]  Ascorbic Acid (VITAMIN C)  1000 MG tablet Take 1,000 mg by mouth daily.   Yes [provider]  metroNIDAZOLE (FLAGYL) 500 MG tablet Take 1 tablet (500 mg total) by mouth 2 (two) times daily. 06/04/20   Karma Ganja, NP  Multiple Vitamin (MULTIVITAMIN) tablet Take 1 tablet by mouth daily.    [provider]  VITAMIN D PO Take by mouth.    [provider]    Family History Family History  Problem Relation Age of Onset  . Cancer Maternal Grandmother        UTERINE  . Diabetes Neg Hx   . CAD Neg Hx   . Breast cancer Neg Hx   . Colon cancer Neg Hx     Social History Social History   Tobacco Use  . Smoking status: Never Smoker  . Smokeless tobacco: Never Used  Vaping Use  . Vaping Use: Never used  Substance Use Topics  . Alcohol use: No    Alcohol/week: 0.0 standard drinks  . Drug use: No     Allergies   Patient has no known allergies.   Review  of Systems Review of Systems  As stated above in HPI Physical Exam Triage Vital Signs ED Triage Vitals [07/23/20 1123]  Enc Vitals Group     BP 134/78     Pulse Rate 80     Resp 16     Temp 97.6 F (36.4 C)     Temp Source Oral     SpO2 100 %     Weight      Height      Head Circumference      Peak Flow      Pain Score      Pain Loc      Pain Edu?      Excl. in [place]?    No data found.  Updated Vital Signs BP 134/78 (BP Location: Right Arm)   Pulse 80   Temp 97.6 F (36.4 C) (Oral)   Resp 16   LMP 07/06/2020 (Exact Date)   SpO2 100%   Physical Exam Vitals reviewed.  Constitutional:      Appearance: Normal appearance.  Musculoskeletal:        General: No swelling or deformity.     Cervical back: Normal.     Thoracic back: Normal.     Lumbar back: Spasms and tenderness present. No bony tenderness. Decreased range of motion. Negative right straight leg raise test and negative left straight leg raise test. No scoliosis.       Back:     Right lower leg: No edema.     Left lower leg: No edema.  Skin:     General: Skin is warm.  Neurological:     General: No focal deficit present.     Mental Status: She is alert and oriented to person, place, and time.     Sensory: No sensory deficit.     Motor: No weakness.     Coordination: Coordination normal.     Gait: Gait normal.     Deep Tendon Reflexes: Reflexes normal.      UC Treatments / Results  Labs (all labs ordered are listed, but only abnormal results are displayed) Labs Reviewed - No data to display  EKG   Radiology No results found.  Procedures Procedures (including critical care time)  Medications Ordered in UC Medications - No data to display  Initial Impression / Assessment and Plan / UC Course  I have reviewed the triage vital signs and the nursing notes.  Pertinent labs & imaging results that were available during my care of the patient were reviewed by me and considered in my medical decision making (see chart for details).     New.  Appears to be early sciatica.  Treating with muscle relaxer Flexeril along with ibuprofen every 8 hours as needed.  We also discussed stretching exercises.  We reviewed how to take her medication along with common potential side effects and precautions.  Work note given per patient request.  We reviewed red flag signs and symptoms and that if symptoms or not improving as expected she will need follow-up with imaging.  At this time she has not had any injury, bony tenderness or red flag symptoms were holding off on imaging to which patient states she is agreeable.  Final Clinical Impressions(s) / UC Diagnoses   Final diagnoses:  None   Discharge Instructions   None    ED Prescriptions    None     PDMP not reviewed this encounter.   Hughie Closs, [place] 07/23/20 1242

## 2020-07-23 NOTE — ED Triage Notes (Signed)
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 Tylenol gives no relief. Denies pain, tingling, numbness in right leg; dysuria, injury.

## 2020-07-30 ENCOUNTER — Encounter: Payer: Self-pay | Admitting: Internal Medicine

## 2020-08-06 ENCOUNTER — Other Ambulatory Visit: Payer: Self-pay

## 2020-08-06 ENCOUNTER — Encounter: Payer: Self-pay | Admitting: Internal Medicine

## 2020-08-06 ENCOUNTER — Ambulatory Visit: Payer: No Typology Code available for payment source | Admitting: Internal Medicine

## 2020-08-06 VITALS — BP 116/76 | HR 82 | Temp 98.1°F | Resp 16 | Ht 62.75 in | Wt 167.4 lb

## 2020-08-06 DIAGNOSIS — M545 Low back pain, unspecified: Secondary | ICD-10-CM | POA: Diagnosis not present

## 2020-08-06 MED ORDER — PREDNISONE 10 MG PO TABS: ORAL_TABLET | ORAL | 0 refills | Status: DC

## 2020-08-06 NOTE — Progress Notes (Signed)
Subjective:    Patient ID: Anita Parker, female    DOB: 1981/05/08, 40 y.o.   MRN: [id number]  DOS:  08/06/2020 Type of visit - description: Acute Symptoms a started 3 weeks ago, severe right-sided back pain with minimal radiation to the right anterior thigh. There was no injury.  Went to urgent care 07/23/2020, was Rx stretching, Flexeril and ibuprofen 800 mg. Reports that that has helped, the pain is not severe but it still very bothersome.  She has a physical job, it is very difficult for her to work.  Denies fever chills No leg paresthesias No bladder or bowel incontinence   Review of Systems See above   Past Medical History:  Diagnosis Date  . Birth control    husband's vasectomy  . Condyloma acuminatum of vulva   . MOLE 04/04/2010    Past Surgical History:  Procedure Laterality Date  . NO PAST SURGERIES      Allergies as of 08/06/2020   No Known Allergies     Medication List       Accurate as of August 06, 2020 11:59 PM. If you have any questions, ask your nurse or doctor.        STOP taking these medications   metroNIDAZOLE 500 MG tablet Commonly known as: FLAGYL Stopped by: Kathlene November, MD     TAKE these medications   acetaminophen 500 MG tablet Commonly known as: TYLENOL Take 500 mg by mouth every 6 (six) hours as needed.   cyclobenzaprine 5 MG tablet Commonly known as: FLEXERIL Take 2 tablets (10 mg total) by mouth at bedtime as needed for muscle spasms. What changed:   how much to take  when to take this Changed by: Kathlene November, MD   ibuprofen 800 MG tablet Commonly known as: ADVIL Take 1 tablet (800 mg total) by mouth every 8 (eight) hours as needed.   multivitamin tablet Take 1 tablet by mouth daily.   predniSONE 10 MG tablet Commonly known as: DELTASONE 4 tablets x 2 days, 3 tabs x 2 days, 2 tabs x 2 days, 1 tab x 2 days Started by: Kathlene November, MD   vitamin C 1000 MG tablet Take 1,000 mg by mouth daily.   VITAMIN D PO Take by  mouth.          Objective:   Physical Exam BP 116/76 (BP Location: Left Arm, Patient Position: Sitting, Cuff Size: Small)   Pulse 82   Temp 98.1 F (36.7 C) (Oral)   Resp 16   Ht 5' 2.75" (1.594 m)   Wt 167 lb 6 oz (75.9 kg)   LMP 07/30/2020 (Exact Date)   SpO2 98%   BMI 29.89 kg/m  General:   Well developed, NAD, BMI noted. HEENT:  Normocephalic . Face symmetric, atraumatic MSK: No TTP at the lumbar sacral spine.  No TTP at the SI joints. Lower extremities: no pretibial edema bilaterally  Skin: Not pale. Not jaundice Neurologic:  alert & oriented X3.  Speech normal, posture somewhat antalgic, gait appropriate and unassisted. DTR symmetric Straight leg test negative Psych--  Cognition and judgment appear intact.  Cooperative with normal attention span and concentration.  Behavior appropriate. No anxious or depressed appearing.      Assessment     Assessment [place] 2011 Condyloma, vulvar Birth control, husband's vasectomy Varicose veins Low vit D   PLAN  Acute lumbalgia: As described above, her job is very physical, needs a work note Plan: Change Flexeril from 1 po  TID to qhs only  so she can drive during the daytime if needed Round of prednisone Tylenol Okay to take the ibuprofen 800 mg BID prn and with food. Stretching is helping recommend to continue. Return to work March 28, bring paperwork if needed If not better consider referral.    This visit occurred during the SARS-CoV-2 public health emergency.  Safety protocols were in place, including screening questions prior to the visit, additional usage of staff PPE, and extensive cleaning of exam room while observing appropriate contact time as indicated for disinfecting solutions.

## 2020-08-06 NOTE — Patient Instructions (Addendum)
TOME FLEXERIL 2 PASTILLAS EN LA NOCHE  TOME PREDNISONE, SIGA LAS INSTRUCCIONES   TOME TYLENOL 500 MG: 2 PASTILLAS CADA 8 HORAS SI LO NCESITA  TAMBIEN PUEDE TOMAR IBUPROFENO DE 800 MG 2 VECS AL DIA CON LAS COMIDAS  SIGA CON LOS ESTIRAMINETOS  TRAIGA LOS DOCUMENTOS DE "FMLA"  [place] 28

## 2020-08-07 NOTE — Assessment & Plan Note (Signed)
Acute lumbalgia: As described above, her job is very physical, needs a work note Plan: Change Flexeril from 1 po TID to qhs only  so she can drive during the daytime if needed Round of prednisone Tylenol Okay to take the ibuprofen 800 mg BID prn and with food. Stretching is helping recommend to continue. Return to work March 28, bring paperwork if needed If not better consider referral.

## 2020-08-16 ENCOUNTER — Encounter: Payer: Self-pay | Admitting: Internal Medicine

## 2020-08-27 IMAGING — CT CT ABDOMEN AND PELVIS WITH CONTRAST
2 of 5 series · 16 of 46 positions shown, 18 images · IV contrast (APPLIED)
Comparison: None.

CLINICAL DATA: Right lower quadrant abdominal pain, appendicitis
suspected

EXAM:
CT ABDOMEN AND PELVIS WITH CONTRAST
TECHNIQUE: Multidetector CT imaging of the abdomen and pelvis was performed
using the standard protocol following bolus administration of
intravenous contrast.
CONTRAST:  100mL OMNIPAQUE IOHEXOL 300 MG/ML  SOLN

[Series 2: axial st · axial · 0.85mm/px · z∈[-435,-25]mm · 13 of 92 slices shown, 15 images]
[im 5/92  soft-tissue]
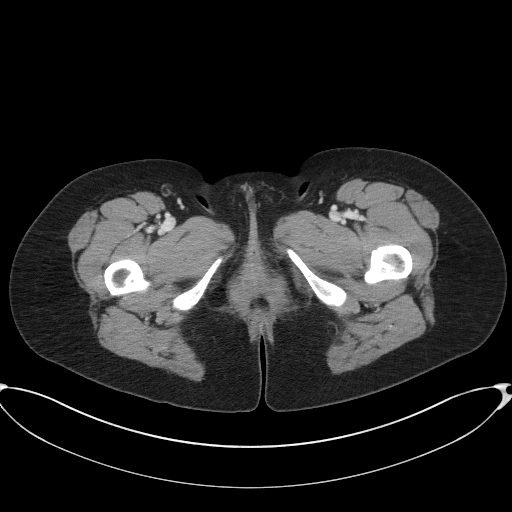
[im 5/92  bone]
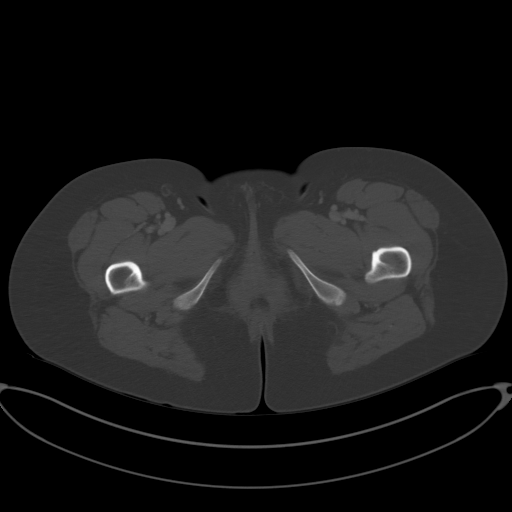
[im 14/92  soft-tissue]
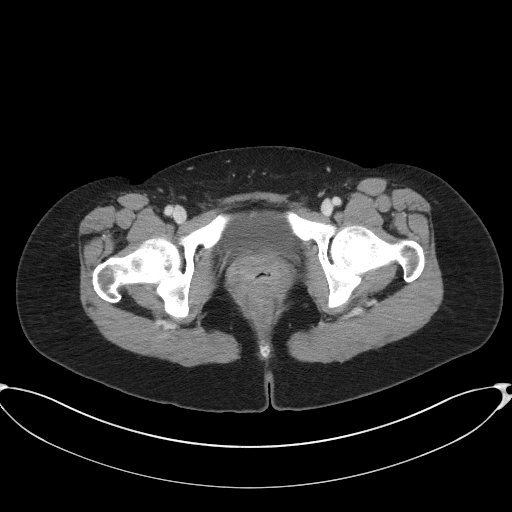
[im 19/92  soft-tissue]
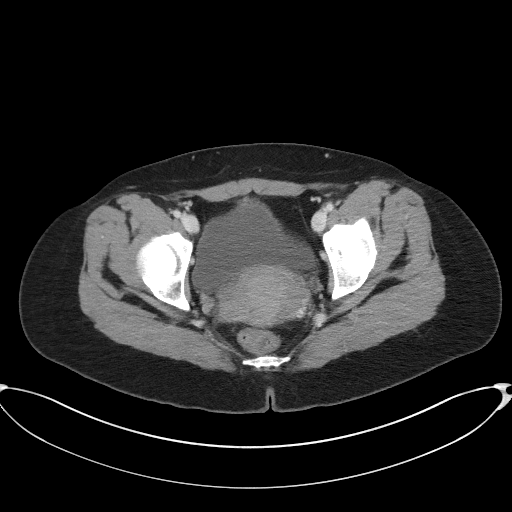
[im 28/92  soft-tissue]
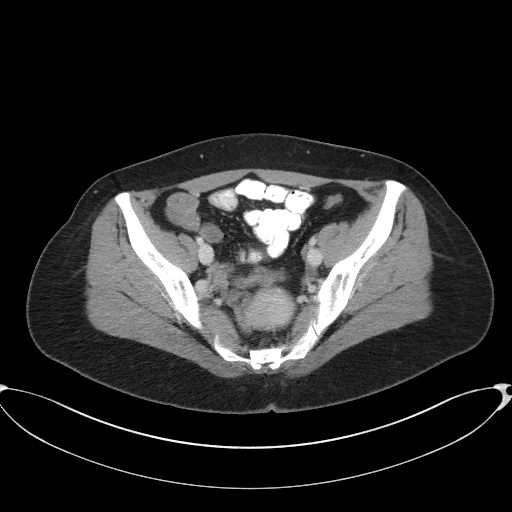
[im 32/92  soft-tissue]
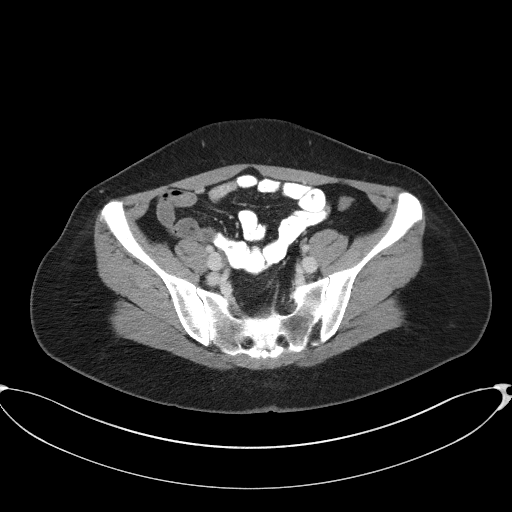
[im 41/92  soft-tissue]
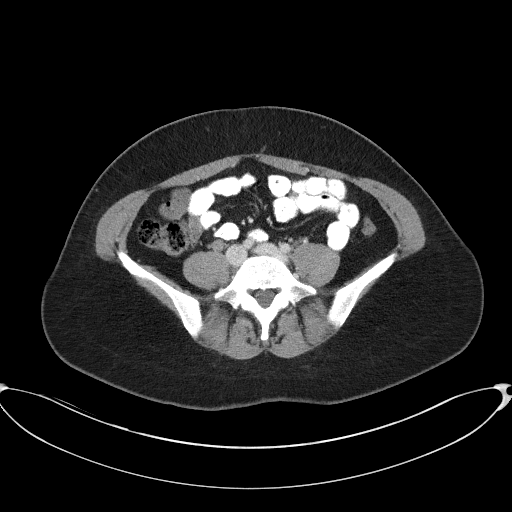
[im 46/92  soft-tissue]
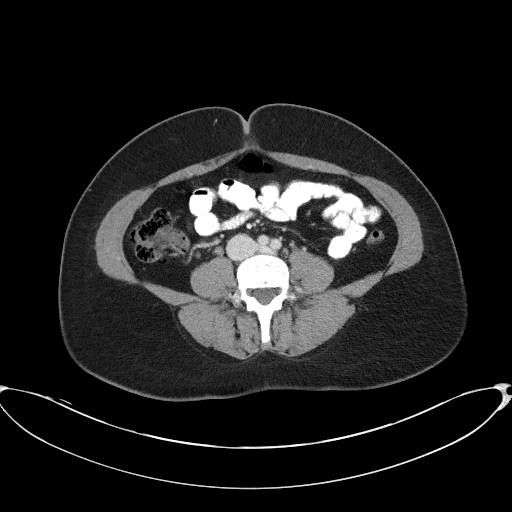
[im 51/92  soft-tissue]
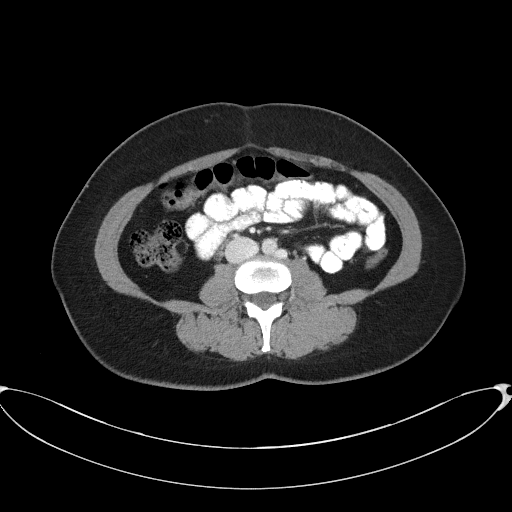
[im 60/92  soft-tissue]
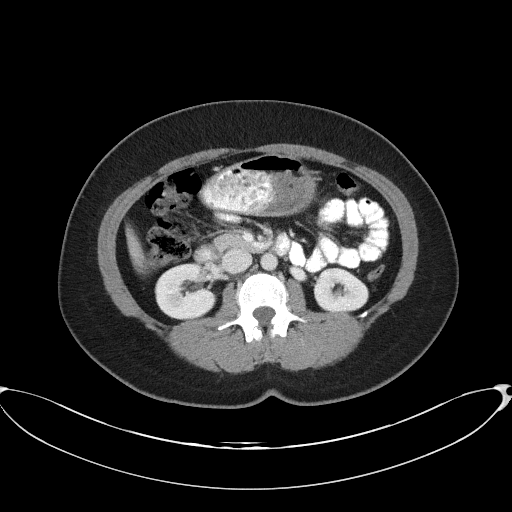
[im 60/92  bone]
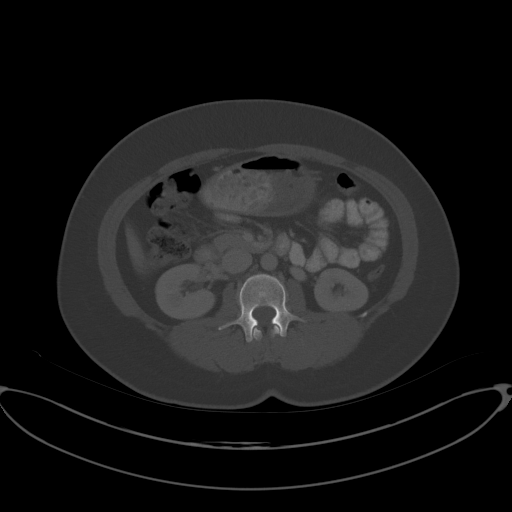
[im 64/92  soft-tissue]
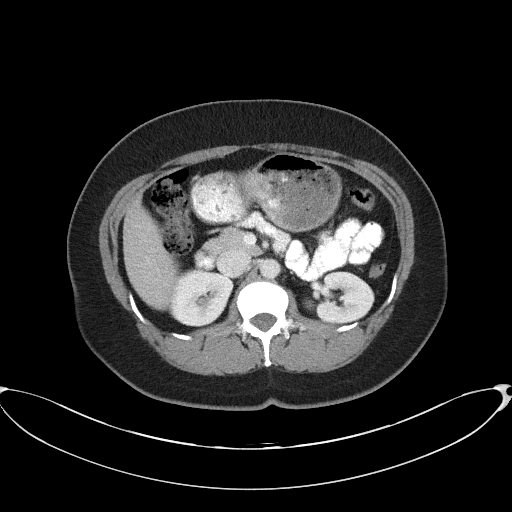
[im 73/92  soft-tissue]
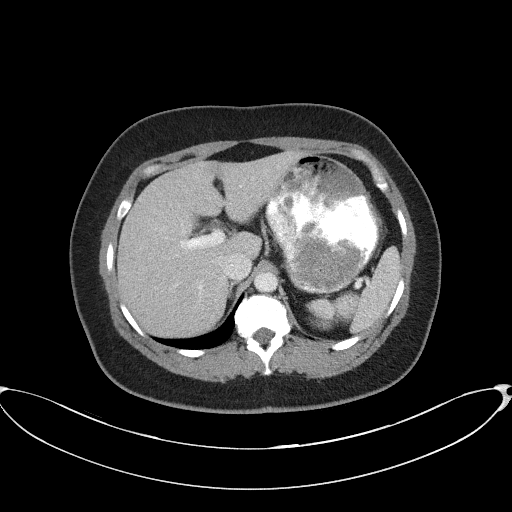
[im 78/92  soft-tissue]
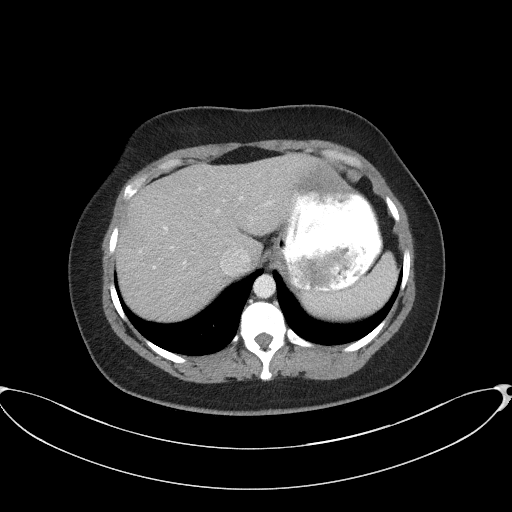
[im 87/92  soft-tissue]
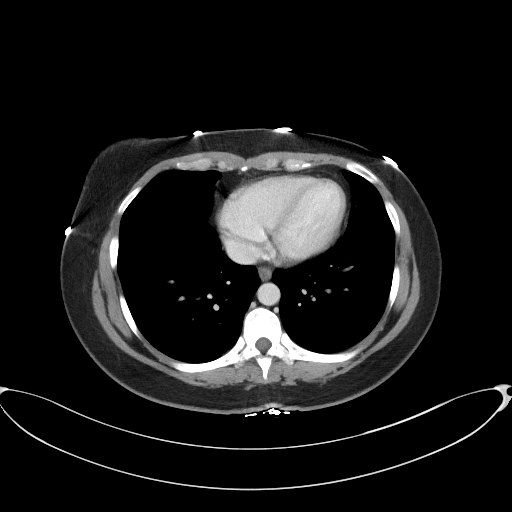

[Series 5: coronal st · coronal · 0.80mm/px · 3 of 87 slices shown]
[im 29/87  soft-tissue]
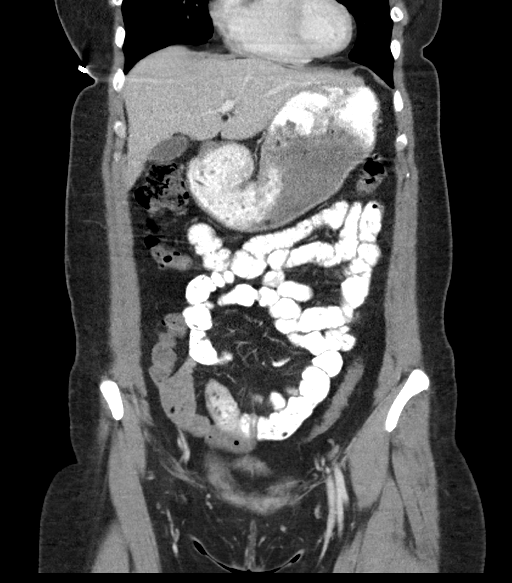
[im 39/87  soft-tissue]
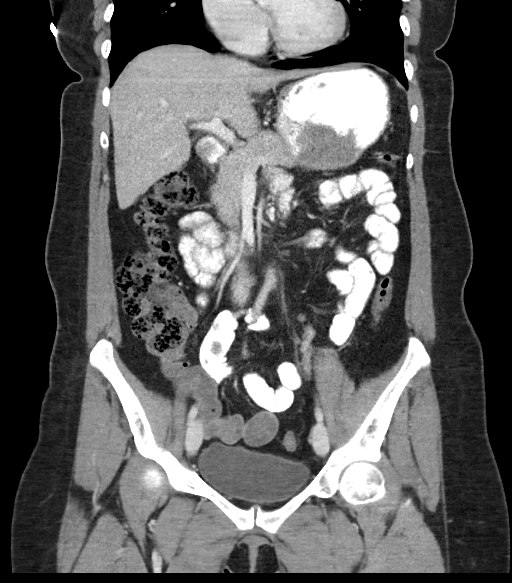
[im 48/87  soft-tissue]
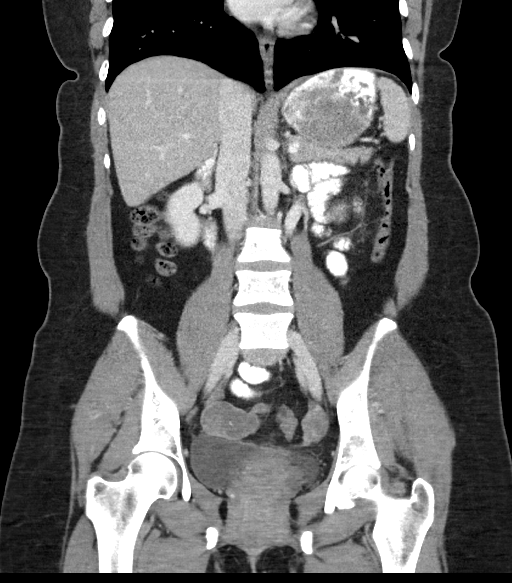

[16 of 46 positions shown; findings below may reference images not displayed]

FINDINGS: Lower chest: No acute abnormality.

Hepatobiliary: Low-attenuation lesion of the posterior right lobe of
the liver, incompletely characterized although likely a cyst or
hemangioma. No gallstones, gallbladder wall thickening, or biliary
dilatation.

Pancreas: Unremarkable. No pancreatic ductal dilatation or
surrounding inflammatory changes.

Spleen: Normal in size without significant abnormality.

Adrenals/Urinary Tract: Adrenal glands are unremarkable. Kidneys are
normal, without renal calculi, solid lesion, or hydronephrosis.
Bladder is unremarkable.

Stomach/Bowel: Stomach is within normal limits. The appendix is
fluid-filled although normal in caliber measuring 6 mm without
adjacent inflammatory findings (series 2, image 48, series 6, image
50). No evidence of bowel wall thickening, distention, or
inflammatory changes.

Vascular/Lymphatic: No significant vascular findings are present. No
enlarged abdominal or pelvic lymph nodes.

Reproductive: No mass or other significant abnormality.

Other: No abdominal wall hernia or abnormality. No abdominopelvic
ascites.

Musculoskeletal: No acute or significant osseous findings.
IMPRESSION: 1. No CT findings of the abdomen or pelvis to explain right lower
quadrant pain.

2. The appendix is fluid-filled although normal in caliber measuring
6 mm without adjacent inflammatory findings (series 2, image 48,
series 6, image 50). This is likely normal although early
appendicitis is not strictly excluded and repeat CT could be
performed in [DATE] hours to observe for interval evolution of
inflammation if there is strong persistent clinical suspicion.

## 2020-11-05 ENCOUNTER — Ambulatory Visit (INDEPENDENT_AMBULATORY_CARE_PROVIDER_SITE_OTHER): Payer: No Typology Code available for payment source | Admitting: Internal Medicine

## 2020-11-05 ENCOUNTER — Other Ambulatory Visit: Payer: Self-pay

## 2020-11-05 ENCOUNTER — Encounter: Payer: Self-pay | Admitting: Internal Medicine

## 2020-11-05 VITALS — BP 116/66 | HR 75 | Temp 98.3°F | Resp 16 | Ht 63.0 in | Wt 165.2 lb

## 2020-11-05 DIAGNOSIS — Z0000 Encounter for general adult medical examination without abnormal findings: Secondary | ICD-10-CM

## 2020-11-05 DIAGNOSIS — R109 Unspecified abdominal pain: Secondary | ICD-10-CM | POA: Diagnosis not present

## 2020-11-05 LAB — POC URINALSYSI DIPSTICK (AUTOMATED)
Bilirubin, UA: NEGATIVE
Blood, UA: NEGATIVE
Glucose, UA: NEGATIVE
Ketones, UA: NEGATIVE
Leukocytes, UA: NEGATIVE
Nitrite, UA: NEGATIVE
Protein, UA: NEGATIVE
Spec Grav, UA: 1.015 (ref 1.010–1.025)
Urobilinogen, UA: 0.2 E.U./dL
pH, UA: 5.5 (ref 5.0–8.0)

## 2020-11-05 LAB — COMPREHENSIVE METABOLIC PANEL
ALT: 23 U/L (ref 0–35)
AST: 20 U/L (ref 0–37)
Albumin: 4.3 g/dL (ref 3.5–5.2)
Alkaline Phosphatase: 46 U/L (ref 39–117)
BUN: 10 mg/dL (ref 6–23)
CO2: 24 mEq/L (ref 19–32)
Calcium: 9 mg/dL (ref 8.4–10.5)
Chloride: 106 mEq/L (ref 96–112)
Creatinine, Ser: 0.71 mg/dL (ref 0.40–1.20)
GFR: 106.95 mL/min (ref >60.00)
Glucose, Bld: 81 mg/dL (ref 70–99)
Potassium: 4 mEq/L (ref 3.5–5.1)
Sodium: 138 mEq/L (ref 135–145)
Total Bilirubin: 0.5 mg/dL (ref 0.2–1.2)
Total Protein: 7 g/dL (ref 6.0–8.3)

## 2020-11-05 LAB — CBC WITH DIFFERENTIAL/PLATELET
Basophils Absolute: 0 10*3/uL (ref 0.0–0.1)
Basophils Relative: 0.3 % (ref 0.0–3.0)
Eosinophils Absolute: 0 10*3/uL (ref 0.0–0.7)
Eosinophils Relative: 0.5 % (ref 0.0–5.0)
HCT: 39.5 % (ref 36.0–46.0)
Hemoglobin: 13.6 g/dL (ref 12.0–15.0)
Lymphocytes Relative: 41.1 % (ref 12.0–46.0)
Lymphs Abs: 1.7 10*3/uL (ref 0.7–4.0)
MCHC: 34.5 g/dL (ref 30.0–36.0)
MCV: 87.6 fl (ref 78.0–100.0)
Monocytes Absolute: 0.2 10*3/uL (ref 0.1–1.0)
Monocytes Relative: 5.7 % (ref 3.0–12.0)
Neutro Abs: 2.2 10*3/uL (ref 1.4–7.7)
Neutrophils Relative %: 52.4 % (ref 43.0–77.0)
Platelets: 210 10*3/uL (ref 150.0–400.0)
RBC: 4.51 Mil/uL (ref 3.87–5.11)
RDW: 12.8 % (ref 11.5–15.5)
WBC: 4.2 10*3/uL (ref 4.0–10.5)

## 2020-11-05 LAB — LIPID PANEL
Cholesterol: 129 mg/dL (ref 0–200)
HDL: 44.7 mg/dL (ref >39.00)
LDL Cholesterol: 75 mg/dL (ref 0–99)
NonHDL: 84.19
Total CHOL/HDL Ratio: 3
Triglycerides: 47 mg/dL (ref 0.0–149.0)
VLDL: 9.4 mg/dL (ref 0.0–40.0)

## 2020-11-05 NOTE — Progress Notes (Signed)
Subjective:    Patient ID: Anita Parker, female    DOB: 08-24-1980, 40 y.o.   MRN: [id number]  DOS:  11/05/2020 Type of visit - description: CPX Here for CPX  Several weeks history of epigastric discomfort, feeling bloated and having epigastric burning after eating. She specifically denies fever, nausea, vomiting.  No diarrhea, no change in the color of the stools.  Her weight fluctuates at home.  Wt Readings from Last 3 Encounters:  11/05/20 165 lb 4 oz (75 kg)  08/06/20 167 lb 6 oz (75.9 kg)  06/04/20 166 lb (75.3 kg)    Review of Systems  Other than above, a 14 point review of systems is negative     Past Medical History:  Diagnosis Date   Birth control    husband's vasectomy   Condyloma acuminatum of vulva    MOLE 04/04/2010   Social History   Socioeconomic History   Marital status: Married    Spouse name: Not on file   Number of children: 3   Years of education: Not on file   Highest education level: Not on file  Occupational History   Occupation: [employer], Comptroller: [employer]  Tobacco Use   Smoking status: Never   Smokeless tobacco: Never  Vaping Use   Vaping Use: Never used  Substance and Sexual Activity   Alcohol use: No    Alcohol/week: 0.0 standard drinks   Drug use: No   Sexual activity: Yes    Partners: Male    Comment: husband vasectomy  Other Topics Concern   Not on file  Social History Narrative   From [place], 4 children, 3 living ones   Household- pt, husband, 2  children      Social Determinants of Health   Financial Resource Strain: Not on file  Food Insecurity: Not on file  Transportation Needs: Not on file  Physical Activity: Not on file  Stress: Not on file  Social Connections: Not on file  Intimate Partner Violence: Not on file    Past Surgical History:  Procedure Laterality Date   NO PAST SURGERIES      Allergies as of 11/05/2020   No Known Allergies       Medication List        Accurate as of November 05, 2020 11:59 PM. If you have any questions, ask your nurse or doctor.          STOP taking these medications    cyclobenzaprine 5 MG tablet Commonly known as: FLEXERIL Stopped by: Kathlene November, MD   ibuprofen 800 MG tablet Commonly known as: ADVIL Stopped by: Kathlene November, MD   multivitamin tablet Stopped by: Kathlene November, MD   predniSONE 10 MG tablet Commonly known as: DELTASONE Stopped by: Kathlene November, MD   vitamin C 1000 MG tablet Stopped by: Kathlene November, MD   VITAMIN D PO Stopped by: Kathlene November, MD       TAKE these medications    acetaminophen 500 MG tablet Commonly known as: TYLENOL Take 500 mg by mouth every 6 (six) hours as needed.   cholecalciferol 25 MCG (1000 UNIT) tablet Commonly known as: VITAMIN D3 Take 2,000 Units by mouth daily.           Objective:   Physical Exam BP 116/66 (BP Location: Left Arm, Patient Position: Sitting, Cuff Size: Small)   Pulse 75   Temp 98.3 F (36.8 C) (Oral)   Resp 16  Ht 5\' 3"  (1.6 m)   Wt 165 lb 4 oz (75 kg)   LMP 10/22/2020 (Approximate)   SpO2 98%   BMI 29.27 kg/m  General: Well developed, NAD, BMI noted Neck: No  thyromegaly  HEENT:  Normocephalic . Face symmetric, atraumatic Lungs:  CTA B Normal respiratory effort, no intercostal retractions, no accessory muscle use. Heart: RRR,  no murmur.  Abdomen:  Not distended, soft, moderately tender at the epigastric area without mass or rebound.  Nontender at the RUQ.   Lower extremities: no pretibial edema bilaterally  Skin: Exposed areas without rash. Not pale. Not jaundice Neurologic:  alert & oriented X3.  Speech normal, gait appropriate for age and unassisted Strength symmetric and appropriate for age.  Psych: Cognition and judgment appear intact.  Cooperative with normal attention span and concentration.  Behavior appropriate. No anxious or depressed appearing.     Assessment    Assessment Mole  2011 Condyloma, vulvar Birth control, husband's vasectomy Varicose veins Low vit D   PLAN  Here for CPX. Epigastric pain: Several weeks history of epigastric bloating postprandial burning.  She is somewhat tender upon palpation.   Udip neg Plan: Check H. pylori, if negative trial with PPIs. RTC 1 year    This visit occurred during the SARS-CoV-2 public health emergency.  Safety protocols were in place, including screening questions prior to the visit, additional usage of staff PPE, and extensive cleaning of exam room while observing appropriate contact time as indicated for disinfecting solutions.

## 2020-11-05 NOTE — Patient Instructions (Signed)
  If the stomach problem is not getting better or if it gets severe : let me know    GO TO THE LAB : Get the blood work     [place], [place] back for a physical exam in 1 year.  Sooner if needed

## 2020-11-06 ENCOUNTER — Encounter: Payer: Self-pay | Admitting: Internal Medicine

## 2020-11-06 NOTE — Assessment & Plan Note (Signed)
-  Tdad 09/2019 - Covid vaccine: has declined so far, pro>> cons d/w pt  -Female care: saw  Gyn 05/2020   - CCS: Never had a cscope , not indicated - Labs: CMP, FLP,CBC, H. pylori -Lifestyle: Trying to eat healthy, very active at work and going to the gym

## 2020-11-06 NOTE — Assessment & Plan Note (Signed)
Here for CPX. Epigastric pain: Several weeks history of epigastric bloating postprandial burning.  She is somewhat tender upon palpation.   Udip neg Plan: Check H. pylori, if negative trial with PPIs. RTC 1 year

## 2020-11-08 LAB — H. PYLORI BREATH TEST: H. pylori Breath Test: NOT DETECTED

## 2020-11-11 MED ORDER — PANTOPRAZOLE SODIUM 40 MG PO TBEC: 40.0000 mg | DELAYED_RELEASE_TABLET | Freq: Every day | ORAL | 1 refills | Status: DC

## 2020-11-11 NOTE — Addendum Note (Signed)
Addended byDamita Dunnings D on: 11/11/2020 09:19 AM   Modules accepted: Orders

## 2021-06-21 ENCOUNTER — Encounter: Payer: Self-pay | Admitting: Nurse Practitioner

## 2021-06-21 ENCOUNTER — Ambulatory Visit: Payer: No Typology Code available for payment source | Admitting: Nurse Practitioner

## 2021-06-21 ENCOUNTER — Other Ambulatory Visit: Payer: Self-pay

## 2021-06-21 VITALS — BP 116/74

## 2021-06-21 DIAGNOSIS — N920 Excessive and frequent menstruation with regular cycle: Secondary | ICD-10-CM

## 2021-06-21 DIAGNOSIS — N898 Other specified noninflammatory disorders of vagina: Secondary | ICD-10-CM

## 2021-06-21 DIAGNOSIS — R1030 Lower abdominal pain, unspecified: Secondary | ICD-10-CM

## 2021-06-21 DIAGNOSIS — B9689 Other specified bacterial agents as the cause of diseases classified elsewhere: Secondary | ICD-10-CM

## 2021-06-21 DIAGNOSIS — N760 Acute vaginitis: Secondary | ICD-10-CM | POA: Diagnosis not present

## 2021-06-21 LAB — WET PREP FOR TRICH, YEAST, CLUE

## 2021-06-21 LAB — TSH: TSH: 2.23 mIU/L

## 2021-06-21 MED ORDER — METRONIDAZOLE 500 MG PO TABS: 500.0000 mg | ORAL_TABLET | Freq: Two times a day (BID) | ORAL | 0 refills | Status: DC

## 2021-06-21 NOTE — Progress Notes (Signed)
° °  Acute Office Visit  Subjective:    Patient ID: Anita Parker, female    DOB: 05/20/1981, 41 y.o.   MRN: [id number]   HPI 41 y.o. presents today for vaginal odor, itching and changes in menstrual cycles. For the last 3 months periods have been occurring every 21 days and are heavier with clots. Prior to this cycles were every 28 days with moderate bleeding. She also complains of a pulling sensation in bilateral lower quadrants. Interpreter present during visit.     Review of Systems  Constitutional: Negative.   Gastrointestinal:  Positive for abdominal pain. Negative for constipation, diarrhea, nausea and vomiting.  Genitourinary:  Positive for menstrual problem. Negative for difficulty urinating, dyspareunia, dysuria, flank pain, frequency, urgency and vaginal discharge.       Vaginal odor, itching      Objective:    Physical Exam Constitutional:      Appearance: Normal appearance.  Abdominal:     Tenderness: There is no abdominal tenderness. There is no guarding or rebound.  Genitourinary:    General: Normal vulva.     Vagina: Vaginal discharge present. No erythema.     Cervix: Normal.     Uterus: Normal.     BP 116/74    LMP 06/15/2021  Wt Readings from Last 3 Encounters:  11/05/20 165 lb 4 oz (75 kg)  08/06/20 167 lb 6 oz (75.9 kg)  06/04/20 166 lb (75.3 kg)   Wet prep + clue cells     Assessment & Plan:   Problem List Items Addressed This Visit   None Visit Diagnoses     BV (bacterial vaginosis)    -  Primary   Relevant Medications   metroNIDAZOLE (FLAGYL) 500 MG tablet   Vaginal odor       Relevant Orders   WET PREP FOR [place], YEAST, CLUE (Completed)   Unusually frequent menses       Relevant Orders   TSH   US PELVIS TRANSVAGINAL NON-OB (TV ONLY)   Lower abdominal pain       Relevant Orders   US PELVIS TRANSVAGINAL NON-OB (TV ONLY)      Plan: Wet prep  positive for clue cells - Flagyl 500 mg BID x 7 days. TSH today. Will schedule pelvic  ultrasound for evaluation of frequent, heavy menses. All questions answered.      Tamela Gammon DNP, 6:43 PM 06/21/2021

## 2021-06-28 ENCOUNTER — Ambulatory Visit: Payer: No Typology Code available for payment source | Admitting: Internal Medicine

## 2021-06-28 ENCOUNTER — Encounter: Payer: Self-pay | Admitting: Internal Medicine

## 2021-06-28 VITALS — BP 116/76 | HR 76 | Temp 98.1°F | Resp 16 | Ht 63.0 in | Wt 168.1 lb

## 2021-06-28 DIAGNOSIS — M545 Low back pain, unspecified: Secondary | ICD-10-CM | POA: Diagnosis not present

## 2021-06-28 MED ORDER — PREDNISONE 10 MG PO TABS: ORAL_TABLET | ORAL | 0 refills | Status: DC

## 2021-06-28 MED ORDER — CYCLOBENZAPRINE HCL 10 MG PO TABS: 10.0000 mg | ORAL_TABLET | Freq: Every evening | ORAL | 0 refills | Status: DC | PRN

## 2021-06-28 NOTE — Progress Notes (Signed)
° °  Subjective:    Patient ID: Anita Parker, female    DOB: April 07, 1981, 41 y.o.   MRN: [id number]  DOS:  06/28/2021 Type of visit - description: Acute  Symptoms a started 2 days ago while she was standing in church. Pain located at the midline lumbar spine. No radiation, increases with twisting her torso or lifting.  Also when she lays down. Denies any injury but her job is physically demanding and she has been suffering on and off with similar pains but to a lesser degree.  Denies any radiation. No paresthesias No bladder or bowel incontinence No fever chills. So far has only taken a couple of OTCs.  Review of Systems See above   Past Medical History:  Diagnosis Date   Birth control    husband's vasectomy   Condyloma acuminatum of vulva    MOLE 04/04/2010    Past Surgical History:  Procedure Laterality Date   NO PAST SURGERIES      Current Outpatient Medications  Medication Instructions   acetaminophen (TYLENOL) 500 mg, Oral, Every 6 hours PRN   cholecalciferol (VITAMIN D3) 2,000 Units, Oral, Daily   metroNIDAZOLE (FLAGYL) 500 mg, Oral, 2 times daily   pantoprazole (PROTONIX) 40 mg, Oral, Daily before breakfast       Objective:   Physical Exam BP 116/76 (BP Location: Left Arm, Patient Position: Sitting, Cuff Size: Small)    Pulse 76    Temp 98.1 F (36.7 C) (Oral)    Resp 16    Ht 5\' 3"  (1.6 m)    Wt 168 lb 2 oz (76.3 kg)    LMP 06/15/2021    SpO2 99%    BMI 29.78 kg/m  General:   Well developed, NAD, BMI noted. HEENT:  Normocephalic . Face symmetric, atraumatic MSK: Mild to moderate TTP at the lumbar spine.  Nontender at the [facility] area. Lower extremities: no pretibial edema bilaterally  Skin: Not pale. Not jaundice Neurologic:  alert & oriented X3.  Speech normal, gait appropriate for age, not antalgic.  + Pain however when she laid down on the examining table. Motor and DTR symmetric Straight leg test negative. Psych--  Cognition and judgment appear  intact.  Cooperative with normal attention span and concentration.  Behavior appropriate. No anxious or depressed appearing.      Assessment     Assessment Mole 2011 Condyloma, vulvar Birth control, husband's vasectomy Varicose veins Low vit D   PLAN  Lumbalgia: Has a mild degree of back pain at work (she does heavy physical labor) and severe symptoms for the last 2 days. No red flag symptoms. Plan: Warm compresses, round of prednisone, Tylenol, call if not gradually better. Also, wrote a letter to her job, she has been working 16 years on a physically demanding area (per pt), she would benefit from working on less demanding area.   This visit occurred during the SARS-CoV-2 public health emergency.  Safety protocols were in place, including screening questions prior to the visit, additional usage of staff PPE, and extensive cleaning of exam room while observing appropriate contact time as indicated for disinfecting solutions.

## 2021-06-28 NOTE — Patient Instructions (Signed)
Tome prednisona por unos dias  Tylenol 500 mg: 1 o 2 tabletas cada 8 horas para el dolor  Compresas calientes  Llame si no mejora pronto

## 2021-06-29 NOTE — Assessment & Plan Note (Signed)
Lumbalgia: Has a mild degree of back pain at work (she does heavy physical labor) and severe symptoms for the last 2 days. No red flag symptoms. Plan: Warm compresses, round of prednisone, Tylenol, call if not gradually better. Also, wrote a letter to her job, she has been working 16 years on a physically demanding area (per pt), she would benefit from working on less demanding area.

## 2021-07-21 ENCOUNTER — Other Ambulatory Visit: Payer: No Typology Code available for payment source

## 2021-07-21 ENCOUNTER — Other Ambulatory Visit: Payer: No Typology Code available for payment source | Admitting: Obstetrics & Gynecology

## 2021-07-26 ENCOUNTER — Ambulatory Visit: Payer: No Typology Code available for payment source | Admitting: Internal Medicine

## 2021-07-26 ENCOUNTER — Encounter: Payer: Self-pay | Admitting: Internal Medicine

## 2021-07-26 VITALS — BP 116/72 | HR 79 | Temp 98.4°F | Ht 63.0 in | Wt 169.0 lb

## 2021-07-26 DIAGNOSIS — M545 Low back pain, unspecified: Secondary | ICD-10-CM | POA: Diagnosis not present

## 2021-07-26 NOTE — Progress Notes (Signed)
? ?  Subjective:  ? ? Patient ID: Anita Parker, female    DOB: 07/26/1980, 41 y.o.   MRN: [id number] ? ?DOS:  07/26/2021 ?Type of visit - description: acute ? ?Was seen a month ago with low back pain. ?Was prescribed prednisone and Flexeril, they   really did  not help much. 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 ? ?Also, reports pain at the right trapezoid area and even at the neck. ?She denies fever chills ?No cough ?No paresthesias except that occasionally has sharp burning pain at the posterior aspect of the right leg. ? ?Review of Systems ?See above  ? ?Past Medical History:  ?Diagnosis Date  ? Birth control   ? husband's vasectomy  ? Condyloma acuminatum of vulva   ? MOLE 04/04/2010  ? ? ?Past Surgical History:  ?Procedure Laterality Date  ? NO PAST SURGERIES    ? ? ?Current Outpatient Medications  ?Medication Instructions  ? acetaminophen (TYLENOL) 500 mg, Oral, Every 6 hours PRN  ? cholecalciferol (VITAMIN D3) 2,000 Units, Oral, Daily  ? cyclobenzaprine (FLEXERIL) 10 mg, Oral, At bedtime PRN  ? metroNIDAZOLE (FLAGYL) 500 mg, Oral, 2 times daily  ? pantoprazole (PROTONIX) 40 mg, Oral, Daily before breakfast  ? predniSONE (DELTASONE) 10 MG tablet 4 tablets x 2 days, 3 tabs x 2 days, 2 tabs x 2 days, 1 tab x 2 days  ? ? ?   ?Objective:  ? Physical Exam ?BP 116/72 (BP Location: Left Arm, Patient Position: Sitting, Cuff Size: Small)   Pulse 79   Temp 98.4 ?F (36.9 ?C) (Oral)   Ht '5\' 3"'$  (1.6 m)   Wt 169 lb (76.7 kg)   LMP 07/01/2021 (Approximate)   SpO2 96%   BMI 29.94 kg/m?  ?General:   ?Well developed, NAD, BMI noted. ?HEENT:  ?Normocephalic . Face symmetric, atraumatic ?Neck: No TTP at the cervical spine.  Range of motion normal ?Lower extremities: no pretibial edema bilaterally  ?Skin: Not pale. Not jaundice ?Neurologic:  ?alert & oriented X3.  ?Speech normal, gait and transferring is slightly antalgic ?Motor and DTR symmetric ?Straight leg test  negative ?Psych--  ?Cognition and judgment appear intact.  ?Cooperative with normal attention span and concentration.  ?Behavior appropriate. ?No anxious or depressed appearing.  ? ?   ?Assessment   ? ? Assessment ?Mole 2011 ?Condyloma, vulvar ?Birth control, husband's vasectomy ?Varicose veins ?Low vit D  ? ?PLAN  ?Lumbalgia: ?Started approximately a month ago, prednisone and Flexeril were prescribed, they did not help. ?She returns to the office because continue with lumbalgia and also is developing right trapezoid and neck pain. ?Gait is slightly antalgic but unassisted. ?Neurological exam essentially normal. ?Plan: ?Refer to Ortho. ?Alternate Tylenol and ibuprofen with GI precautions. ?All above discussed with the patient in [place]. ? ?  ? ?This visit occurred during the SARS-CoV-2 public health emergency.  Safety protocols were in place, including screening questions prior to the visit, additional usage of staff PPE, and extensive cleaning of exam room while observing appropriate contact time as indicated for disinfecting solutions.  ? ?

## 2021-07-26 NOTE — Patient Instructions (Signed)
La vamos a referir al ortopedista ? ?Tome ibuprofeno 200 mg: dos tabletas en las man~anas con el desayuno ? ?Tome tylenol 500 mg: 2 tabletas en la tarde ? ? ?

## 2021-07-27 NOTE — Assessment & Plan Note (Signed)
Lumbalgia: ?Started approximately a month ago, prednisone and Flexeril were prescribed, they did not help. ?She returns to the office because continue with lumbalgia and also is developing right trapezoid and neck pain. ?Gait is slightly antalgic but unassisted. ?Neurological exam essentially normal. ?Plan: ?Refer to Ortho. ?Alternate Tylenol and ibuprofen with GI precautions. ?All above discussed with the patient in [place]. ?

## 2021-09-09 ENCOUNTER — Encounter: Payer: Self-pay | Admitting: Obstetrics & Gynecology

## 2021-09-09 ENCOUNTER — Other Ambulatory Visit (HOSPITAL_COMMUNITY)
Admission: RE | Admit: 2021-09-09 | Discharge: 2021-09-09 | Disposition: A | Discharge status: Home / Self Care | Payer: No Typology Code available for payment source | Source: Ambulatory Visit | Attending: Obstetrics & Gynecology | Admitting: Obstetrics & Gynecology

## 2021-09-09 ENCOUNTER — Ambulatory Visit (INDEPENDENT_AMBULATORY_CARE_PROVIDER_SITE_OTHER): Payer: No Typology Code available for payment source | Admitting: Obstetrics & Gynecology

## 2021-09-09 VITALS — BP 114/80 | HR 70 | Resp 16 | Ht 62.0 in | Wt 170.0 lb

## 2021-09-09 DIAGNOSIS — Z9189 Other specified personal risk factors, not elsewhere classified: Secondary | ICD-10-CM | POA: Diagnosis not present

## 2021-09-09 DIAGNOSIS — Z01419 Encounter for gynecological examination (general) (routine) without abnormal findings: Secondary | ICD-10-CM | POA: Diagnosis not present

## 2021-09-09 NOTE — Progress Notes (Signed)
? ? ?Anita Parker 09/21/1980 [id number] ? ? ?History:    41 y.o.  [id number] Married.  Vasectomy. Children 24 yo, 31 and 57 yo. ?  ?RP:  Established patient presenting for annual gyn exam  ?  ?HPI: Menstrual.'s every months with normal flow.  No breakthrough bleeding.  No pelvic pain.  No pain with intercourse. No h/o abnormal Paps.  Pap Neg 11/2018.  Pap reflex today.  Urine and bowel movements normal.  Breasts normal. Will schedule a screening mammo now. Body mass index 31.09.  Not exercising regularly.  Health labs with family physician. ? ? ?Past medical history,surgical history, family history and social history were all reviewed and documented in the EPIC chart. ? ?Gynecologic History ?Patient's last menstrual period was 08/26/2021 (exact date). ? ?Obstetric History ?OB History  ?Gravida Para Term Preterm AB Living  ?'4 3 3   1 3  '$ ?SAB IAB Ectopic Multiple Live Births  ?1       3  ?  ?# Outcome Date GA Lbr Len/2nd Weight Sex Delivery Anes PTL Lv  ?4 SAB           ?3 Term     M Vag-Spont  N LIV  ?2 Term     F Vag-Spont  N LIV  ?1 Term     M Vag-Spont  N LIV  ? ? ? ?ROS: A ROS was performed and pertinent positives and negatives are included in the history. ? GENERAL: No fevers or chills. HEENT: No change in vision, no earache, sore throat or sinus congestion. NECK: No pain or stiffness. CARDIOVASCULAR: No chest pain or pressure. No palpitations. PULMONARY: No shortness of breath, cough or wheeze. GASTROINTESTINAL: No abdominal pain, nausea, vomiting or diarrhea, melena or bright red blood per rectum. GENITOURINARY: No urinary frequency, urgency, hesitancy or dysuria. MUSCULOSKELETAL: No joint or muscle pain, no back pain, no recent trauma. DERMATOLOGIC: No rash, no itching, no lesions. ENDOCRINE: No polyuria, polydipsia, no heat or cold intolerance. No recent change in weight. HEMATOLOGICAL: No anemia or easy bruising or bleeding. NEUROLOGIC: No headache, seizures, numbness, tingling or weakness. PSYCHIATRIC: No  depression, no loss of interest in normal activity or change in sleep pattern.  ?  ? ?Exam: ? ? ?BP 114/80   Pulse 70   Resp 16   Ht '5\' 2"'$  (1.575 m)   Wt 170 lb (77.1 kg)   LMP 08/26/2021 (Exact Date)   BMI 31.09 kg/m?  ? ?Body mass index is 31.09 kg/m?. ? ?General appearance : Well developed well nourished female. No acute distress ?HEENT: Eyes: no retinal hemorrhage or exudates,  Neck supple, trachea midline, no carotid bruits, no thyroidmegaly ?Lungs: Clear to auscultation, no rhonchi or wheezes, or rib retractions  ?Heart: Regular rate and rhythm, no murmurs or gallops ?Breast:Examined in sitting and supine position were symmetrical in appearance, no palpable masses or tenderness,  no skin retraction, no nipple inversion, no nipple discharge, no skin discoloration, no axillary or supraclavicular lymphadenopathy ?Abdomen: no palpable masses or tenderness, no rebound or guarding ?Extremities: no edema or skin discoloration or tenderness ? ?Pelvic: Vulva: Normal ?            Vagina: No gross lesions or discharge ? Cervix: No gross lesions or discharge.  Pap reflex done. ? Uterus  RV, normal size, shape and consistency, non-tender and mobile ? Adnexa  Without masses or tenderness ? Anus: Normal ? ? ?Assessment/Plan:  41 y.o. female for annual exam  ? ?1. Encounter for routine  gynecological examination with Papanicolaou smear of cervix ?Menstrual.'s every months with normal flow.  No breakthrough bleeding.  No pelvic pain.  No pain with intercourse. No h/o abnormal Paps.  Pap Neg 11/2018.  Pap reflex today.  Urine and bowel movements normal.  Breasts normal. Will schedule a screening mammo now. Body mass index 31.09.  Not exercising regularly.  Health labs with family physician. ?- Cytology - PAP( [place]) ? ?2. Relies on partner vasectomy for contraception  ? ?Princess Bruins MD, 3:53 PM 09/09/2021 ? ?  ?

## 2021-09-14 LAB — CYTOLOGY - PAP: Diagnosis: NEGATIVE

## 2021-10-05 ENCOUNTER — Encounter: Payer: Self-pay | Admitting: Internal Medicine

## 2022-01-16 ENCOUNTER — Encounter: Payer: Self-pay | Admitting: Internal Medicine

## 2022-06-07 ENCOUNTER — Ambulatory Visit (INDEPENDENT_AMBULATORY_CARE_PROVIDER_SITE_OTHER): Payer: Managed Care, Other (non HMO) | Admitting: Internal Medicine

## 2022-06-07 ENCOUNTER — Encounter: Payer: Self-pay | Admitting: Internal Medicine

## 2022-06-07 VITALS — BP 134/70 | HR 73 | Temp 98.1°F | Resp 16 | Ht 62.0 in | Wt 166.0 lb

## 2022-06-07 DIAGNOSIS — R8290 Unspecified abnormal findings in urine: Secondary | ICD-10-CM

## 2022-06-07 DIAGNOSIS — Z0000 Encounter for general adult medical examination without abnormal findings: Secondary | ICD-10-CM | POA: Diagnosis not present

## 2022-06-07 DIAGNOSIS — E559 Vitamin D deficiency, unspecified: Secondary | ICD-10-CM

## 2022-06-07 DIAGNOSIS — N938 Other specified abnormal uterine and vaginal bleeding: Secondary | ICD-10-CM | POA: Diagnosis not present

## 2022-06-07 LAB — POC URINALSYSI DIPSTICK (AUTOMATED)
Bilirubin, UA: NEGATIVE
Blood, UA: NEGATIVE
Glucose, UA: NEGATIVE
Ketones, UA: NEGATIVE
Leukocytes, UA: NEGATIVE
Nitrite, UA: NEGATIVE
Protein, UA: NEGATIVE
Spec Grav, UA: 1.01 (ref 1.010–1.025)
Urobilinogen, UA: 0.2 E.U./dL
pH, UA: 7.5 (ref 5.0–8.0)

## 2022-06-07 LAB — POCT URINE PREGNANCY: Preg Test, Ur: NEGATIVE

## 2022-06-07 NOTE — Progress Notes (Signed)
Subjective:    Patient ID: Anita Parker, female    DOB: [birth year], 42 y.o.   MRN: [id number]  DOS:  06/07/2022 Type of visit - description: cpx  Here for CPX In general feeling well.  Did  report in the last few months her menses have been very heavy. Last period started 12 days ago, lasted approximately 5 days, afterwards she was spotting but that has stopped.  It was the heaviest menses  she had this year . She has been left with some right lower quadrant discomfort. Denies fever or chills, no vaginal discharge per se. No diarrhea, no dysuria or gross hematuria.  Also, reports occasional fatigue, denies snoring.  Review of Systems  Other than above, a 14 point review of systems is negative     Past Medical History:  Diagnosis Date   Birth control    husband's vasectomy   Condyloma acuminatum of vulva    MOLE 04/04/2010    Past Surgical History:  Procedure Laterality Date   NO PAST SURGERIES     Social History   Socioeconomic History   Marital status: Married    Spouse name: Not on file   Number of children: 3   Years of education: Not on file   Highest education level: Not on file  Occupational History   Occupation: [employer], Comptroller: [employer]  Tobacco Use   Smoking status: Never   Smokeless tobacco: Never  Vaping Use   Vaping Use: Never used  Substance and Sexual Activity   Alcohol use: No    Alcohol/week: 0.0 standard drinks of alcohol   Drug use: No   Sexual activity: Yes    Partners: Male    Birth control/protection: Other-see comments    Comment: husband vasectomy  Other Topics Concern   Not on file  Social History Narrative   From [place], 4 children, 3 living ones   Household- pt, husband, 3  children      Social Determinants of Radio broadcast assistant Strain: Not on file  Food Insecurity: Not on file  Transportation Needs: Not on file  Physical Activity: Not on file  Stress: Not  on file  Social Connections: Not on file  Intimate Partner Violence: Not on file     Current Outpatient Medications  Medication Instructions   cholecalciferol (VITAMIN D3) 1,000 Units, Oral, Daily       Objective:   Physical Exam BP 134/70   Pulse 73   Temp 98.1 F (36.7 C) (Oral)   Resp 16   Ht '5\' 2"'$  (1.575 m)   Wt 166 lb (75.3 kg)   SpO2 97%   BMI 30.36 kg/m  General: Well developed, NAD, BMI noted Neck: No  thyromegaly  HEENT:  Normocephalic . Face symmetric, atraumatic Lungs:  CTA B Normal respiratory effort, no intercostal retractions, no accessory muscle use. Heart: RRR,  no murmur.  Abdomen:  Not distended, soft, slightly TTP at the right lower quadrant without mass or rebound. Lower extremities: no pretibial edema bilaterally  Skin: Exposed areas without rash. Not pale. Not jaundice Neurologic:  alert & oriented X3.  Speech normal, gait appropriate for age and unassisted Strength symmetric and appropriate for age.  Psych: Cognition and judgment appear intact.  Cooperative with normal attention span and concentration.  Behavior appropriate. No anxious or depressed appearing.     Assessment    Assessment Mole 2011 Condyloma, vulvar Birth control, husband's vasectomy Varicose veins  Low vit D   PLAN  Here for CPX DUB: Reports menses have been much heavier this year, the last menses was 12 days ago and has left her with some right lower quadrant discomfort.  (No fever or chills, no vaginal discharge, no LUTS)  UPT today: Negative Urine cloudy,  Udip neg,  will send a UA urine culture Plan: Refer to gynecology, likes to change to a different group, will send her to [facility] for women's health care.  ER if symptoms severe. Lumbalgia, back pain: See previous visits, since she change positions at her job, things are better. RTC 1 year

## 2022-06-07 NOTE — Patient Instructions (Addendum)
We are referring you to a new gynecologist  ([facility]). Please schedule an appointment as early as possible.  You can call them: [phone]  If you develop fever, chills, increased pain, severe bleeding: Go to the ER   GO TO THE LAB : Get the blood work     [place], [place] back for a physical exam in 1 year

## 2022-06-08 ENCOUNTER — Encounter: Payer: Self-pay | Admitting: Internal Medicine

## 2022-06-08 LAB — URINALYSIS, ROUTINE W REFLEX MICROSCOPIC
Bilirubin Urine: NEGATIVE
Hgb urine dipstick: NEGATIVE
Ketones, ur: NEGATIVE
Leukocytes,Ua: NEGATIVE
Nitrite: NEGATIVE
RBC / HPF: NONE SEEN (ref 0–?)
Specific Gravity, Urine: 1.015 (ref 1.000–1.030)
Total Protein, Urine: NEGATIVE
Urine Glucose: NEGATIVE
Urobilinogen, UA: 0.2 (ref 0.0–1.0)
WBC, UA: NONE SEEN (ref 0–?)
pH: 7.5 (ref 5.0–8.0)

## 2022-06-08 LAB — COMPREHENSIVE METABOLIC PANEL
ALT: 15 U/L (ref 0–35)
AST: 18 U/L (ref 0–37)
Albumin: 4.4 g/dL (ref 3.5–5.2)
Alkaline Phosphatase: 49 U/L (ref 39–117)
BUN: 14 mg/dL (ref 6–23)
CO2: 27 mEq/L (ref 19–32)
Calcium: 9.1 mg/dL (ref 8.4–10.5)
Chloride: 104 mEq/L (ref 96–112)
Creatinine, Ser: 0.88 mg/dL (ref 0.40–1.20)
GFR: 81.75 mL/min (ref >60.00)
Glucose, Bld: 88 mg/dL (ref 70–99)
Potassium: 4 mEq/L (ref 3.5–5.1)
Sodium: 138 mEq/L (ref 135–145)
Total Bilirubin: 0.2 mg/dL (ref 0.2–1.2)
Total Protein: 7.3 g/dL (ref 6.0–8.3)

## 2022-06-08 LAB — CBC WITH DIFFERENTIAL/PLATELET
Basophils Absolute: 0 10*3/uL (ref 0.0–0.1)
Basophils Relative: 0.5 % (ref 0.0–3.0)
Eosinophils Absolute: 0.1 10*3/uL (ref 0.0–0.7)
Eosinophils Relative: 1.1 % (ref 0.0–5.0)
HCT: 38.4 % (ref 36.0–46.0)
Hemoglobin: 12.8 g/dL (ref 12.0–15.0)
Lymphocytes Relative: 37.2 % (ref 12.0–46.0)
Lymphs Abs: 2.6 10*3/uL (ref 0.7–4.0)
MCHC: 33.4 g/dL (ref 30.0–36.0)
MCV: 89.6 fl (ref 78.0–100.0)
Monocytes Absolute: 0.5 10*3/uL (ref 0.1–1.0)
Monocytes Relative: 6.6 % (ref 3.0–12.0)
Neutro Abs: 3.9 10*3/uL (ref 1.4–7.7)
Neutrophils Relative %: 54.6 % (ref 43.0–77.0)
Platelets: 231 10*3/uL (ref 150.0–400.0)
RBC: 4.29 Mil/uL (ref 3.87–5.11)
RDW: 13.3 % (ref 11.5–15.5)
WBC: 7.1 10*3/uL (ref 4.0–10.5)

## 2022-06-08 LAB — URINE CULTURE
MICRO NUMBER:: 14439039
Result:: NO GROWTH
SPECIMEN QUALITY:: ADEQUATE

## 2022-06-08 LAB — IRON: Iron: 47 ug/dL (ref 42–145)

## 2022-06-08 LAB — LIPID PANEL
Cholesterol: 168 mg/dL (ref 0–200)
HDL: 56.8 mg/dL (ref >39.00)
LDL Cholesterol: 97 mg/dL (ref 0–99)
NonHDL: 110.92
Total CHOL/HDL Ratio: 3
Triglycerides: 72 mg/dL (ref 0.0–149.0)
VLDL: 14.4 mg/dL (ref 0.0–40.0)

## 2022-06-08 LAB — VITAMIN D 25 HYDROXY (VIT D DEFICIENCY, FRACTURES): VITD: 21.14 ng/mL — ABNORMAL LOW (ref 30.00–100.00)

## 2022-06-08 LAB — FERRITIN: Ferritin: 14.9 ng/mL (ref 10.0–291.0)

## 2022-06-08 NOTE — Assessment & Plan Note (Signed)
Here for CPX DUB: Reports menses have been much heavier this year, the last menses was 12 days ago and has left her with some right lower quadrant discomfort.  (No fever or chills, no vaginal discharge, no LUTS)  UPT today: Negative Urine cloudy,  Udip neg,  will send a UA urine culture Plan: Refer to gynecology, likes to change to a different group, will send her to [facility] for women's health care.  ER if symptoms severe. Lumbalgia, back pain: See previous visits, since she change positions at her job, things are better. RTC 1 year

## 2022-06-08 NOTE — Assessment & Plan Note (Signed)
-  Tdad 09/2019 - Covid vaccine: has declined consistently  - flu shot declined consistently  -Female care: see comments under DUB - CCS: Never had a cscope , not indicated - Labs: CMP FLP CBC iron ferritin vitamin D -Lifestyle: Counseled.

## 2022-06-09 MED ORDER — VITAMIN D (ERGOCALCIFEROL) 1.25 MG (50000 UNIT) PO CAPS: 50000.0000 [IU] | ORAL_CAPSULE | ORAL | 0 refills | Status: DC

## 2022-06-09 NOTE — Addendum Note (Signed)
Addended byDamita Dunnings D on: 06/09/2022 07:52 AM   Modules accepted: Orders

## 2023-03-05 ENCOUNTER — Other Ambulatory Visit (HOSPITAL_COMMUNITY): Payer: Self-pay | Admitting: Family Medicine

## 2023-03-05 ENCOUNTER — Encounter (HOSPITAL_COMMUNITY): Payer: Self-pay | Admitting: Emergency Medicine

## 2023-03-05 ENCOUNTER — Ambulatory Visit (HOSPITAL_COMMUNITY)
Admission: EM | Admit: 2023-03-05 | Discharge: 2023-03-05 | Disposition: A | Discharge status: Home / Self Care | Payer: Managed Care, Other (non HMO) | Attending: Family Medicine | Admitting: Family Medicine

## 2023-03-05 DIAGNOSIS — N644 Mastodynia: Secondary | ICD-10-CM

## 2023-03-05 DIAGNOSIS — M25531 Pain in right wrist: Secondary | ICD-10-CM | POA: Diagnosis not present

## 2023-03-05 DIAGNOSIS — N6001 Solitary cyst of right breast: Secondary | ICD-10-CM

## 2023-03-05 DIAGNOSIS — M549 Dorsalgia, unspecified: Secondary | ICD-10-CM | POA: Diagnosis not present

## 2023-03-05 MED ORDER — IBUPROFEN 800 MG PO TABS: 800.0000 mg | ORAL_TABLET | Freq: Three times a day (TID) | ORAL | 0 refills | Status: DC | PRN

## 2023-03-05 NOTE — ED Provider Notes (Signed)
[facility]    CSN: [id number] Arrival date & time: 03/05/23  1224      History   Chief Complaint Chief Complaint  Patient presents with   Back Pain    HPI Anita Parker is a 42 y.o. female.    Back Pain Here for right breast pain.  This began about a month ago.  She states it is worse when she is having her period  Also she has had right upper back pain for some time now, at least a few months.  It is worse with movement. She notes some pain in her right wrist at the ulnar side and that is new.  No recent trauma or fall.  She does clean for a living  No allergies to medications  Last menstrual cycle was October 1.     Past Medical History:  Diagnosis Date   Birth control    husband's vasectomy   Condyloma acuminatum of vulva    MOLE 04/04/2010    Patient Active Problem List   Diagnosis Date Noted   Annual physical exam 09/14/2015   PCP NOTES >>>>>>>>>>>>>>>>>>>>>>>>> 09/14/2015   Shoulder sprain 08/26/2012   Condyloma acuminatum of vulva 02/22/2012   Benign neoplasm of skin 04/04/2010    Past Surgical History:  Procedure Laterality Date   NO PAST SURGERIES      OB History     Gravida  4   Para  3   Term  3   Preterm      AB  1   Living  3      SAB  1   IAB      Ectopic      Multiple      Live Births  3            Home Medications    Prior to Admission medications   Medication Sig Start Date End Date Taking? Authorizing Provider  ibuprofen (ADVIL) 800 MG tablet Take 1 tablet (800 mg total) by mouth every 8 (eight) hours as needed (pain). 03/05/23  Yes Zenia Resides, MD  cholecalciferol (VITAMIN D3) 25 MCG (1000 UNIT) tablet Take 1,000 Units by mouth daily.    [provider]  Vitamin D, Ergocalciferol, (DRISDOL) 1.25 MG (50000 UNIT) CAPS capsule Take 1 capsule (50,000 Units total) by mouth every 7 (seven) days. 06/09/22   Wanda Plump, MD    Family History Family History  Problem Relation  Age of Onset   Cancer Maternal Grandmother        UTERINE   Diabetes Neg Hx    CAD Neg Hx    Breast cancer Neg Hx    Colon cancer Neg Hx     Social History Social History   Tobacco Use   Smoking status: Never   Smokeless tobacco: Never  Vaping Use   Vaping status: Never Used  Substance Use Topics   Alcohol use: No    Alcohol/week: 0.0 standard drinks of alcohol   Drug use: No     Allergies   Patient has no known allergies.   Review of Systems Review of Systems  Musculoskeletal:  Positive for back pain.     Physical Exam Triage Vital Signs ED Triage Vitals  Encounter Vitals Group     BP 03/05/23 1314 125/82     Systolic BP Percentile --      Diastolic BP Percentile --      Pulse Rate 03/05/23 1314 78     Resp  03/05/23 1314 16     Temp 03/05/23 1314 98.3 F (36.8 C)     Temp Source 03/05/23 1314 Oral     SpO2 03/05/23 1314 98 %     Weight --      Height --      Head Circumference --      Peak Flow --      Pain Score 03/05/23 1311 7     Pain Loc --      Pain Education --      Exclude from Growth Chart --    No data found.  Updated Vital Signs BP 125/82 (BP Location: Left Arm)   Pulse 78   Temp 98.3 F (36.8 C) (Oral)   Resp 16   LMP 02/20/2023 (Approximate)   SpO2 98%   Visual Acuity Right Eye Distance:   Left Eye Distance:   Bilateral Distance:    Right Eye Near:   Left Eye Near:    Bilateral Near:     Physical Exam Vitals reviewed.  Constitutional:      General: She is not in acute distress.    Appearance: She is not ill-appearing, toxic-appearing or diaphoretic.  HENT:     Mouth/Throat:     Mouth: Mucous membranes are moist.  Eyes:     Extraocular Movements: Extraocular movements intact.     Pupils: Pupils are equal, round, and reactive to light.  Cardiovascular:     Rate and Rhythm: Normal rate and regular rhythm.     Heart sounds: No murmur heard. Pulmonary:     Effort: Pulmonary effort is normal. No respiratory distress.      Breath sounds: Normal breath sounds. No stridor. No wheezing, rhonchi or rales.  Chest:     Comments: On the right breast, there is some tenderness laterally and a little nodularity though no discrete mass at the time of exam. Musculoskeletal:     Cervical back: Neck supple.     Comments: There is some tenderness at the right intrascapular area.  No deformity or rash.  Her right wrist is without any swelling or edema or erythema.  Also no deformity there.  Lymphadenopathy:     Cervical: No cervical adenopathy.  Skin:    Coloration: Skin is not pale.  Neurological:     General: No focal deficit present.     Mental Status: She is alert and oriented to person, place, and time.  Psychiatric:        Behavior: Behavior normal.      UC Treatments / Results  Labs (all labs ordered are listed, but only abnormal results are displayed) Labs Reviewed - No data to display  EKG   Radiology No results found.  Procedures Procedures (including critical care time)  Medications Ordered in UC Medications - No data to display  Initial Impression / Assessment and Plan / UC Course  I have reviewed the triage vital signs and the nursing notes.  Pertinent labs & imaging results that were available during my care of the patient were reviewed by me and considered in my medical decision making (see chart for details).      Mammogram with ultrasound is ordered.  She has not had one in several years.  It looks like her gynecologist did want to order one at her last checkup but the mammogram has not been accomplished.  She is also established with family medicine, and I have asked her to follow-up with both specialties about her upper back pain and  her breast pain.  I recommended vitamin E  Ibuprofen is sent in for as needed pain relief.  Her wrist is benign in appearance, and she can follow-up with her primary care about that. Final Clinical Impressions(s) / UC Diagnoses   Final  diagnoses:  Upper back pain on right side  Right wrist pain  Breast pain, right     Discharge Instructions      Take ibuprofen 800 mg--1 tab every 8 hours as needed for pain. (Tome ibuprofeno 800 mg (1 pastilla cada 8 horas segn sea necesario para el dolor).  Please call [phone] to schedule the mammogram/ultrasound of your breasts. (Llame al [phone] para programar la mamografa/ultrasonido de sus senos.)  Please call your family medicine doctor and you gynecologist to followup all these issues.(Por favor llame a su mdico de familia y a su gineclogo para dar seguimiento a todos Doctor, general practice.)  Taking vitamin E 400 units daily can help the pain from cysts in your breasts. (Tomar 400 unidades de vitamina E al da puede [employer] dolor de los [employer] senos.)    ED Prescriptions     Medication Sig Dispense Auth. Provider   ibuprofen (ADVIL) 800 MG tablet Take 1 tablet (800 mg total) by mouth every 8 (eight) hours as needed (pain). 21 tablet Eban Weick, Janace Aris, MD      PDMP not reviewed this encounter.   Zenia Resides, MD 03/05/23 [phone]

## 2023-03-05 NOTE — Discharge Instructions (Addendum)
Take ibuprofen 800 mg--1 tab every 8 hours as needed for pain. (Tome ibuprofeno 800 mg (1 pastilla cada 8 horas segn sea necesario para el dolor).  Please call [phone] to schedule the mammogram/ultrasound of your breasts. (Llame al [phone] para programar la mamografa/ultrasonido de sus senos.)  Please call your family medicine doctor and you gynecologist to followup all these issues.(Por favor llame a su mdico de familia y a su gineclogo para dar seguimiento a todos Doctor, general practice.)  Taking vitamin E 400 units daily can help the pain from cysts in your breasts. (Tomar 400 unidades de vitamina E al da puede [employer] dolor de los [employer] senos.)

## 2023-03-05 NOTE — ED Notes (Signed)
Pt ambulated out for discharge at this time with support person. Pt is vitally stable, respirations regular/unlabored/even, gait is steady on departure.

## 2023-03-05 NOTE — ED Triage Notes (Signed)
Pt c/o of back pain at spine that goes to right arm and under her breast. Pt states back pain started several months ago and pain moved to side about 1 month ago and to hand in the last two weeks.

## 2023-03-07 ENCOUNTER — Inpatient Hospital Stay
Admission: RE | Admit: 2023-03-07 | Discharge: 2023-03-07 | Discharge status: Home / Self Care | Payer: Managed Care, Other (non HMO) | Source: Ambulatory Visit | Attending: Family Medicine

## 2023-03-07 ENCOUNTER — Ambulatory Visit
Admission: RE | Admit: 2023-03-07 | Discharge: 2023-03-07 | Disposition: A | Discharge status: Home / Self Care | Payer: Managed Care, Other (non HMO) | Source: Ambulatory Visit | Attending: Family Medicine

## 2023-03-07 ENCOUNTER — Other Ambulatory Visit (HOSPITAL_COMMUNITY): Payer: Self-pay | Admitting: Family Medicine

## 2023-03-07 ENCOUNTER — Ambulatory Visit
Admission: RE | Admit: 2023-03-07 | Discharge: 2023-03-07 | Disposition: A | Discharge status: Home / Self Care | Payer: Managed Care, Other (non HMO) | Source: Ambulatory Visit | Attending: Family Medicine | Admitting: Family Medicine

## 2023-03-07 DIAGNOSIS — N6001 Solitary cyst of right breast: Secondary | ICD-10-CM

## 2023-03-07 DIAGNOSIS — R928 Other abnormal and inconclusive findings on diagnostic imaging of breast: Secondary | ICD-10-CM

## 2023-03-07 DIAGNOSIS — N644 Mastodynia: Secondary | ICD-10-CM

## 2023-03-20 ENCOUNTER — Ambulatory Visit (INDEPENDENT_AMBULATORY_CARE_PROVIDER_SITE_OTHER): Payer: Managed Care, Other (non HMO) | Admitting: Obstetrics and Gynecology

## 2023-03-20 ENCOUNTER — Encounter: Payer: Self-pay | Admitting: Obstetrics and Gynecology

## 2023-03-20 VITALS — BP 100/68 | HR 70 | Resp 14 | Wt 160.0 lb

## 2023-03-20 DIAGNOSIS — N939 Abnormal uterine and vaginal bleeding, unspecified: Secondary | ICD-10-CM | POA: Insufficient documentation

## 2023-03-20 DIAGNOSIS — R7989 Other specified abnormal findings of blood chemistry: Secondary | ICD-10-CM | POA: Diagnosis not present

## 2023-03-20 LAB — PREGNANCY, URINE: Preg Test, Ur: NEGATIVE

## 2023-03-20 NOTE — Assessment & Plan Note (Signed)
Will discuss management after initial labs.

## 2023-03-20 NOTE — Progress Notes (Signed)
42 y.o. [id number] female here for irregular bleeding. Married, vasectomy.  Patient's last menstrual period was 03/07/2023 (approximate).  [place] interpreter was present.   Reports two periods month of October- 10/1 and 10/16 with heavy bleeding. Intercourse painful (dry), no sexual partner change, no urinary symptoms, no vaginitis symptoms. Previous cycles lasted for 6d.  Also notes cyclical breast pain prior to cycle and dysmenorrhea with cycle. No current pain at this time  Birth control: vasectomy Last mammogram: 03/07/23 BIRADS 2, density c Sexually active: yes   GYN HISTORY: No significant history  OB History  Gravida Para Term Preterm AB Living  4 3 3   1 3   SAB IAB Ectopic Multiple Live Births  1       3    # Outcome Date GA Lbr Len/2nd Weight Sex Type Anes PTL Lv  4 SAB           3 Term     M Vag-Spont  N LIV  2 Term     F Vag-Spont  N LIV  1 Term     M Vag-Spont  N LIV    Past Medical History:  Diagnosis Date   Birth control    husband's vasectomy   Condyloma acuminatum of vulva    MOLE 04/04/2010    Past Surgical History:  Procedure Laterality Date   NO PAST SURGERIES      Current Outpatient Medications on File Prior to Visit  Medication Sig Dispense Refill   cholecalciferol (VITAMIN D3) 25 MCG (1000 UNIT) tablet Take 1,000 Units by mouth daily.     ibuprofen (ADVIL) 800 MG tablet Take 1 tablet (800 mg total) by mouth every 8 (eight) hours as needed (pain). 21 tablet 0   No current facility-administered medications on file prior to visit.    No Known Allergies    PE Today's Vitals   03/20/23 1406  BP: 100/68  Pulse: 70  Resp: 14  Weight: 160 lb (72.6 kg)   Body mass index is 29.26 kg/m.  Physical Exam Vitals reviewed. Exam conducted with a chaperone present.  Constitutional:      General: She is not in acute distress.    Appearance: Normal appearance.  HENT:     Head: Normocephalic and atraumatic.     Nose: Nose normal.  Eyes:      Extraocular Movements: Extraocular movements intact.     Conjunctiva/sclera: Conjunctivae normal.  Pulmonary:     Effort: Pulmonary effort is normal.  Genitourinary:    General: Normal vulva.     Exam position: Lithotomy position.     Vagina: Normal. No vaginal discharge.     Cervix: Normal. No cervical motion tenderness, discharge or lesion.     Uterus: Normal. Not enlarged and not tender.      Adnexa: Right adnexa normal and left adnexa normal.  Musculoskeletal:        General: Normal range of motion.     Cervical back: Normal range of motion.  Neurological:     General: No focal deficit present.     Mental Status: She is alert.  Psychiatric:        Mood and Affect: Mood normal.        Behavior: Behavior normal.       Assessment and Plan:        Abnormal uterine bleeding (AUB) Assessment & Plan: Will discuss management after initial labs.  Orders: -     Thyroid Panel With TSH -  FSH/LH -     Prolactin -     Pregnancy, urine -     US PELVIS TRANSVAGINAL NON-OB (TV ONLY); Future -     CBC     Logen Fowle Lasandra Beech, MD

## 2023-03-21 DIAGNOSIS — R7989 Other specified abnormal findings of blood chemistry: Secondary | ICD-10-CM | POA: Insufficient documentation

## 2023-03-21 LAB — CBC
HCT: 40.4 % (ref 35.0–45.0)
Hemoglobin: 13.4 g/dL (ref 11.7–15.5)
MCH: 30 pg (ref 27.0–33.0)
MCHC: 33.2 g/dL (ref 32.0–36.0)
MCV: 90.4 fL (ref 80.0–100.0)
MPV: 11.1 fL (ref 7.5–12.5)
Platelets: 217 10*3/uL (ref 140–400)
RBC: 4.47 10*6/uL (ref 3.80–5.10)
RDW: 11.7 % (ref 11.0–15.0)
WBC: 5.6 10*3/uL (ref 3.8–10.8)

## 2023-03-21 LAB — FSH/LH
FSH: 40.7 m[IU]/mL
LH: 15.1 m[IU]/mL

## 2023-03-21 LAB — THYROID PANEL WITH TSH
Free Thyroxine Index: 2.4 (ref 1.4–3.8)
T3 Uptake: 30 % (ref 22–35)
T4, Total: 8 ug/dL (ref 5.1–11.9)
TSH: 1.28 m[IU]/L

## 2023-03-21 LAB — PROLACTIN: Prolactin: 6 ng/mL

## 2023-03-21 NOTE — Addendum Note (Signed)
Addended by: Darrell Jewel V on: 03/21/2023 02:06 PM   Modules accepted: Orders

## 2023-04-03 ENCOUNTER — Other Ambulatory Visit: Payer: Managed Care, Other (non HMO)

## 2023-04-03 ENCOUNTER — Other Ambulatory Visit: Payer: Managed Care, Other (non HMO) | Admitting: Obstetrics and Gynecology

## 2023-04-12 ENCOUNTER — Other Ambulatory Visit: Payer: Managed Care, Other (non HMO) | Admitting: Obstetrics and Gynecology

## 2023-04-12 ENCOUNTER — Other Ambulatory Visit: Payer: Managed Care, Other (non HMO)

## 2023-04-13 ENCOUNTER — Other Ambulatory Visit: Payer: Self-pay | Admitting: *Deleted

## 2023-04-13 ENCOUNTER — Other Ambulatory Visit: Payer: Managed Care, Other (non HMO)

## 2023-04-13 DIAGNOSIS — R7989 Other specified abnormal findings of blood chemistry: Secondary | ICD-10-CM

## 2023-04-13 DIAGNOSIS — N939 Abnormal uterine and vaginal bleeding, unspecified: Secondary | ICD-10-CM

## 2023-04-20 LAB — ESTRADIOL: Estradiol: 19 pg/mL

## 2023-04-20 LAB — ESTRADIOL, FREE

## 2023-04-20 LAB — FOLLICLE STIMULATING HORMONE: FSH: 39.6 m[IU]/mL

## 2023-05-10 ENCOUNTER — Encounter: Payer: Self-pay | Admitting: Chiropractic Medicine

## 2023-05-18 ENCOUNTER — Ambulatory Visit (INDEPENDENT_AMBULATORY_CARE_PROVIDER_SITE_OTHER): Payer: Managed Care, Other (non HMO) | Admitting: Internal Medicine

## 2023-05-18 VITALS — BP 109/53 | HR 72 | Temp 98.6°F | Resp 16 | Ht 62.0 in | Wt 161.0 lb

## 2023-05-18 DIAGNOSIS — S239XXA Sprain of unspecified parts of thorax, initial encounter: Secondary | ICD-10-CM | POA: Diagnosis not present

## 2023-05-18 DIAGNOSIS — M25531 Pain in right wrist: Secondary | ICD-10-CM | POA: Diagnosis not present

## 2023-05-18 MED ORDER — PREDNISONE 10 MG PO TABS: ORAL_TABLET | ORAL | 0 refills | Status: DC

## 2023-05-18 NOTE — Patient Instructions (Signed)
Take prednisone as prescribed for few days  Tylenol  500 mg OTC 2 tabs a day every 8 hours as needed for pain   ICE the wrist  Use a brace.  Will refer you to orthopedics for your wrist pain      Next visit with me next month for a physical  please schedule it at the front desk

## 2023-05-18 NOTE — Progress Notes (Unsigned)
   Subjective:    Patient ID: Anita Parker, female    DOB: 07-05-1980, 42 y.o.   MRN: [id number]  DOS:  05/18/2023 Type of visit - description: Acute  Several months history of pain located by the right shoulder blade.  Worse with certain right arm movements particularly when she reaches forward.  No neck pain per se. No injuries, no falls.  Also for the last 3 weeks had noted moderate to severe pain at the right wrist, some swelling as well. No paresthesias. 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  Wt Readings from Last 3 Encounters:  05/18/23 161 lb (73 kg)  03/20/23 160 lb (72.6 kg)  06/07/22 166 lb (75.3 kg)     Review of Systems See above   Past Medical History:  Diagnosis Date   Birth control    husband's vasectomy   Condyloma acuminatum of vulva    MOLE 04/04/2010    Past Surgical History:  Procedure Laterality Date   NO PAST SURGERIES      Current Outpatient Medications  Medication Instructions   cholecalciferol (VITAMIN D3) 1,000 Units, Daily   ibuprofen (ADVIL) 800 mg, Oral, Every 8 hours PRN   predniSONE (DELTASONE) 10 MG tablet 4 tablets x 2 days, 3 tabs x 2 days, 2 tabs x 2 days, 1 tab x 2 days       Objective:   Physical Exam Skin:        BP (!) 109/53 (BP Location: Left Arm, Patient Position: Sitting, Cuff Size: Small)   Pulse 72   Temp 98.6 F (37 C) (Oral)   Resp 16   Ht 5\' 2"  (1.575 m)   Wt 161 lb (73 kg)   SpO2 99%   BMI 29.45 kg/m  General:   Well developed, NAD, BMI noted. Neck: No TTP of the cervical spine, range of motion normal. MSK: + TTP by the R scapula.  See graphic. Upper extremities: Left wrist normal Right wrist: Range of motion is normal but it causes pain.  Upon palpation no warmness, no no redness but mild puffiness at the cubital side.  No deformities. Lower extremities: no pretibial edema bilaterally  Skin: Not pale. Not jaundice Neurologic:  alert & oriented  X3.  Speech normal, gait appropriate for age and unassisted Psych--  Cognition and judgment appear intact.  Cooperative with normal attention span and concentration.  Behavior appropriate. No anxious or depressed appearing.      Assessment    Assessment Mole 2011 Condyloma, vulvar Birth control, husband's vasectomy Varicose veins Low vit D   PLAN  Thoracic sprain: For now recommend a round of prednisone, Tylenol, if not better likely will need physical therapy. Right wrist pain: Possibly overuse, recommend a CTS brace and refer to Ortho. All instructions printed in English and discussed in Spanish, she verbalized understanding. Follow-up next month for a physical exam

## 2023-05-19 NOTE — Assessment & Plan Note (Signed)
Thoracic sprain: For now recommend a round of prednisone, Tylenol, if not better likely will need physical therapy. Right wrist pain: Possibly overuse, recommend a CTS brace and refer to Ortho. All instructions printed in English and discussed in Spanish, she verbalized understanding. Follow-up next month for a physical exam

## 2023-05-21 NOTE — Addendum Note (Signed)
Addended by: Conrad [place] D on: 05/21/2023 07:34 AM   Modules accepted: Orders

## 2023-05-29 ENCOUNTER — Other Ambulatory Visit: Payer: Managed Care, Other (non HMO) | Admitting: Obstetrics and Gynecology

## 2023-05-29 ENCOUNTER — Other Ambulatory Visit: Payer: Managed Care, Other (non HMO)

## 2023-08-14 ENCOUNTER — Ambulatory Visit (INDEPENDENT_AMBULATORY_CARE_PROVIDER_SITE_OTHER): Payer: Managed Care, Other (non HMO) | Admitting: Internal Medicine

## 2023-08-14 ENCOUNTER — Encounter: Payer: Self-pay | Admitting: Internal Medicine

## 2023-08-14 VITALS — BP 120/68 | HR 66 | Temp 98.2°F | Resp 16 | Ht 62.0 in | Wt 156.1 lb

## 2023-08-14 DIAGNOSIS — Z0000 Encounter for general adult medical examination without abnormal findings: Secondary | ICD-10-CM | POA: Diagnosis not present

## 2023-08-14 DIAGNOSIS — E559 Vitamin D deficiency, unspecified: Secondary | ICD-10-CM

## 2023-08-14 LAB — COMPREHENSIVE METABOLIC PANEL
ALT: 11 U/L (ref 0–35)
AST: 14 U/L (ref 0–37)
Albumin: 4.4 g/dL (ref 3.5–5.2)
Alkaline Phosphatase: 40 U/L (ref 39–117)
BUN: 9 mg/dL (ref 6–23)
CO2: 26 meq/L (ref 19–32)
Calcium: 9.1 mg/dL (ref 8.4–10.5)
Chloride: 106 meq/L (ref 96–112)
Creatinine, Ser: 0.75 mg/dL (ref 0.40–1.20)
GFR: 98.22 mL/min (ref >60.00)
Glucose, Bld: 85 mg/dL (ref 70–99)
Potassium: 3.8 meq/L (ref 3.5–5.1)
Sodium: 139 meq/L (ref 135–145)
Total Bilirubin: 0.4 mg/dL (ref 0.2–1.2)
Total Protein: 7.1 g/dL (ref 6.0–8.3)

## 2023-08-14 LAB — LIPID PANEL
Cholesterol: 165 mg/dL (ref 0–200)
HDL: 53.7 mg/dL (ref >39.00)
LDL Cholesterol: 100 mg/dL — ABNORMAL HIGH (ref 0–99)
NonHDL: 111.46
Total CHOL/HDL Ratio: 3
Triglycerides: 57 mg/dL (ref 0.0–149.0)
VLDL: 11.4 mg/dL (ref 0.0–40.0)

## 2023-08-14 LAB — VITAMIN D 25 HYDROXY (VIT D DEFICIENCY, FRACTURES): VITD: 19.71 ng/mL — ABNORMAL LOW (ref 30.00–100.00)

## 2023-08-14 NOTE — Patient Instructions (Signed)
   GO TO THE LAB : Get the blood work     Please go to the front desk: Arrange a physical exam in 1 year

## 2023-08-14 NOTE — Assessment & Plan Note (Signed)
 Here for CPX - Tdad 09/2019 -Vaccines I recommend: Flu shot every fall, COVID-vaccine. -Female care: Per gynecology, LOV October 2024 MMG 03/07/2023 - CCS: Never had a cscope , not indicated - Labs:  CMP FLP vitamin D -Lifestyle: Doing well, trying to eat healthy, very active at work, exercises, + weight loss noted.

## 2023-08-14 NOTE — Progress Notes (Signed)
 Subjective:    Patient ID: Anita Parker, female    DOB: [birth year], 43 y.o.   MRN: [id number]  DOS:  08/14/2023 Type of visit - description: CPX, here with her daughter  Here for CPX Feeling well, has no major concerns, menses have changed: They come every 2 months, they are heavy for 4 days and light for the following 2 days.   Wt Readings from Last 3 Encounters:  08/14/23 156 lb 2 oz (70.8 kg)  05/18/23 161 lb (73 kg)  03/20/23 160 lb (72.6 kg)     Review of Systems  Other than above, a 14 point review of systems is negative       Past Medical History:  Diagnosis Date   Birth control    husband's vasectomy   Condyloma acuminatum of vulva    MOLE 04/04/2010    Past Surgical History:  Procedure Laterality Date   NO PAST SURGERIES     Social History   Socioeconomic History   Marital status: Married    Spouse name: Not on file   Number of children: 3   Years of education: Not on file   Highest education level: Not on file  Occupational History   Occupation: [employer], Hospital doctor: [employer]  Tobacco Use   Smoking status: Never   Smokeless tobacco: Never  Vaping Use   Vaping status: Never Used  Substance and Sexual Activity   Alcohol use: No    Alcohol/week: 0.0 standard drinks of alcohol   Drug use: No   Sexual activity: Yes    Partners: Male    Birth control/protection: Other-see comments    Comment: husband vasectomy  Other Topics Concern   Not on file  Social History Narrative   From [place] 3 children (1 miscarriage )   Household- pt, husband, 2 children      Social Drivers of Corporate investment banker Strain: Not on file  Food Insecurity: Not on file  Transportation Needs: Not on file  Physical Activity: Not on file  Stress: Not on file  Social Connections: Not on file  Intimate Partner Violence: Not on file    No current outpatient medications      Objective:   Physical Exam BP  120/68   Pulse 66   Temp 98.2 F (36.8 C) (Oral)   Resp 16   Ht 5\' 2"  (1.575 m)   Wt 156 lb 2 oz (70.8 kg)   LMP 08/11/2023 (Exact Date)   SpO2 97%   BMI 28.56 kg/m  General: Well developed, NAD, BMI noted Neck: No  thyromegaly  HEENT:  Normocephalic . Face symmetric, atraumatic Lungs:  CTA B Normal respiratory effort, no intercostal retractions, no accessory muscle use. Heart: RRR,  no murmur.  Abdomen:  Not distended, soft, non-tender. No rebound or rigidity.   Lower extremities: no pretibial edema bilaterally  Skin: Exposed areas without rash. Not pale. Not jaundice Neurologic:  alert & oriented X3.  Speech normal, gait appropriate for age and unassisted Strength symmetric and appropriate for age.  Psych: Cognition and judgment appear intact.  Cooperative with normal attention span and concentration.  Behavior appropriate. No anxious or depressed appearing.     Assessment    Assessment Condyloma, vulvar Birth control, husband's vasectomy Varicose veins Low vit D   PLAN  Here for CPX - Tdad 09/2019 -Vaccines I recommend: Flu shot every fall, COVID-vaccine. -Female care: Per gynecology, LOV October 2024 MMG 03/07/2023 -  CCS: Never had a cscope , not indicated - Labs:  CMP FLP vitamin D -Lifestyle: Doing well, trying to eat healthy, very active at work, exercises, + weight loss noted. Other issues addressed: Vitamin D deficiency, not taking supplements, checking levels. DUB: Follow-up by gynecology, last CBC showed no anemia. RTC 1 year

## 2023-08-14 NOTE — Assessment & Plan Note (Signed)
 Here for CPX  Other issues addressed: Vitamin D deficiency, not taking supplements, checking levels. DUB: Follow-up by gynecology, last CBC showed no anemia. RTC 1 year

## 2023-08-16 MED ORDER — VITAMIN D (ERGOCALCIFEROL) 1.25 MG (50000 UNIT) PO CAPS: 50000.0000 [IU] | ORAL_CAPSULE | ORAL | 0 refills | Status: AC

## 2023-08-16 NOTE — Addendum Note (Signed)
 Addended byConrad Alamo D on: 08/16/2023 07:53 AM   Modules accepted: Orders

## 2023-08-23 ENCOUNTER — Telehealth: Payer: Self-pay

## 2023-08-23 NOTE — Telephone Encounter (Signed)
 See below-Ruth can you try contacting Pt please?

## 2023-08-23 NOTE — Telephone Encounter (Signed)
 Conrad [place] Hope, CMA 08/16/2023  7:54 AM EDT Back to Top    Results mailed w/ ergocalciferol rx.   Wanda Plump, MD 08/15/2023  7:19 PM EDT     The 10-year ASCVD risk score (Arnett DK, et al., 2019) is: 0.4% Print a prescription for ergocalciferol 50000 units 1 tablet weekly x 3 months Mail it with a letter Byrd Hesselbach, -Sus pruebas salieron bien excepto por la Viatmina D que esta baja. Daphene Jaeger D-3:  2,000 unidades al dia (over-the-counter) hasta su suiguiente visita - Ademas tome ergocalciferol una vez por semana por 3 meses (incluimos una receta) Your blood work looks very good except for your vitamin D, it is low. You are taking over-the-counter vitamin D-3  2000 units daily. Also for the next 3 months take ergocalciferol 50,000 units once a week.

## 2023-08-23 NOTE — Telephone Encounter (Signed)
 Copied from CRM [id number]. Topic: Clinical - Lab/Test Results >> Aug 23, 2023  2:03 PM Anita Parker wrote: Reason for CRM: Patient called back would like for someone to call her regarding the lab results

## 2023-08-27 NOTE — Telephone Encounter (Signed)
 Information reviewed with patient and all questions answered.  This conversation was in spanish.
